# Patient Record
Sex: Female | Born: 1945 | Race: White | Hispanic: Yes | Smoking: Never smoker
Health system: Southern US, Community
[De-identification: ages and names within clinical notes are randomized; demographics above are authoritative.]

## PROBLEM LIST (undated history)

## (undated) DIAGNOSIS — F419 Anxiety disorder, unspecified: Secondary | ICD-10-CM

## (undated) DIAGNOSIS — I639 Cerebral infarction, unspecified: Secondary | ICD-10-CM

## (undated) DIAGNOSIS — F0150 Vascular dementia without behavioral disturbance: Secondary | ICD-10-CM

## (undated) DIAGNOSIS — I82409 Acute embolism and thrombosis of unspecified deep veins of unspecified lower extremity: Secondary | ICD-10-CM

## (undated) DIAGNOSIS — F319 Bipolar disorder, unspecified: Secondary | ICD-10-CM

## (undated) DIAGNOSIS — I10 Essential (primary) hypertension: Secondary | ICD-10-CM

## (undated) DIAGNOSIS — G309 Alzheimer's disease, unspecified: Secondary | ICD-10-CM

## (undated) DIAGNOSIS — E119 Type 2 diabetes mellitus without complications: Secondary | ICD-10-CM

## (undated) DIAGNOSIS — G4730 Sleep apnea, unspecified: Secondary | ICD-10-CM

## (undated) DIAGNOSIS — I2510 Atherosclerotic heart disease of native coronary artery without angina pectoris: Secondary | ICD-10-CM

## (undated) DIAGNOSIS — F0280 Dementia in other diseases classified elsewhere without behavioral disturbance: Secondary | ICD-10-CM

## (undated) HISTORY — DX: Acute embolism and thrombosis of unspecified deep veins of unspecified lower extremity: I82.409

## (undated) HISTORY — DX: Bipolar disorder, unspecified: F31.9

## (undated) HISTORY — DX: Atherosclerotic heart disease of native coronary artery without angina pectoris: I25.10

## (undated) HISTORY — DX: Vascular dementia, unspecified severity, without behavioral disturbance, psychotic disturbance, mood disturbance, and anxiety: F01.50

## (undated) HISTORY — DX: Sleep apnea, unspecified: G47.30

## (undated) HISTORY — DX: Anxiety disorder, unspecified: F41.9

## (undated) HISTORY — DX: Alzheimer's disease, unspecified: G30.9

## (undated) HISTORY — DX: Dementia in other diseases classified elsewhere without behavioral disturbance: F02.80

---

## 2001-10-17 HISTORY — PX: CARDIAC CATHETERIZATION: SHX172

## 2001-10-17 HISTORY — PX: OTHER SURGICAL HISTORY: SHX169

## 2006-01-03 ENCOUNTER — Ambulatory Visit: Payer: Self-pay | Admitting: Internal Medicine

## 2006-03-01 ENCOUNTER — Ambulatory Visit: Payer: Self-pay | Admitting: Internal Medicine

## 2006-03-06 ENCOUNTER — Inpatient Hospital Stay (HOSPITAL_COMMUNITY): Admission: AD | Admit: 2006-03-06 | Discharge: 2006-03-11 | Payer: Self-pay | Admitting: Internal Medicine

## 2006-03-06 ENCOUNTER — Ambulatory Visit: Payer: Self-pay | Admitting: Internal Medicine

## 2006-03-06 ENCOUNTER — Ambulatory Visit: Payer: Self-pay | Admitting: *Deleted

## 2006-03-09 ENCOUNTER — Encounter: Payer: Self-pay | Admitting: Internal Medicine

## 2006-03-15 ENCOUNTER — Ambulatory Visit: Payer: Self-pay | Admitting: Internal Medicine

## 2006-03-16 ENCOUNTER — Inpatient Hospital Stay (HOSPITAL_COMMUNITY): Admission: EM | Admit: 2006-03-16 | Discharge: 2006-03-21 | Payer: Self-pay | Admitting: Emergency Medicine

## 2006-03-22 ENCOUNTER — Ambulatory Visit: Payer: Self-pay | Admitting: Internal Medicine

## 2006-03-24 ENCOUNTER — Ambulatory Visit: Payer: Self-pay | Admitting: Internal Medicine

## 2006-03-24 ENCOUNTER — Encounter: Admission: RE | Admit: 2006-03-24 | Discharge: 2006-03-24 | Payer: Self-pay | Admitting: Internal Medicine

## 2006-03-29 ENCOUNTER — Encounter: Admission: RE | Admit: 2006-03-29 | Discharge: 2006-03-29 | Payer: Self-pay | Admitting: Internal Medicine

## 2006-04-03 ENCOUNTER — Ambulatory Visit: Payer: Self-pay | Admitting: Internal Medicine

## 2006-04-06 ENCOUNTER — Inpatient Hospital Stay (HOSPITAL_COMMUNITY): Admission: EM | Admit: 2006-04-06 | Discharge: 2006-04-10 | Payer: Self-pay | Admitting: *Deleted

## 2006-04-14 ENCOUNTER — Ambulatory Visit: Payer: Self-pay | Admitting: Internal Medicine

## 2006-04-25 ENCOUNTER — Ambulatory Visit: Payer: Self-pay | Admitting: Internal Medicine

## 2006-05-14 ENCOUNTER — Ambulatory Visit: Payer: Self-pay | Admitting: Internal Medicine

## 2006-05-14 ENCOUNTER — Observation Stay (HOSPITAL_COMMUNITY): Admission: EM | Admit: 2006-05-14 | Discharge: 2006-05-16 | Payer: Self-pay | Admitting: Emergency Medicine

## 2006-05-24 ENCOUNTER — Ambulatory Visit: Payer: Self-pay | Admitting: Internal Medicine

## 2006-05-26 ENCOUNTER — Ambulatory Visit: Payer: Self-pay | Admitting: Internal Medicine

## 2006-06-15 ENCOUNTER — Ambulatory Visit: Payer: Self-pay | Admitting: Internal Medicine

## 2006-06-22 ENCOUNTER — Ambulatory Visit (HOSPITAL_COMMUNITY): Admission: RE | Admit: 2006-06-22 | Discharge: 2006-06-22 | Payer: Self-pay | Admitting: Gastroenterology

## 2006-06-26 ENCOUNTER — Ambulatory Visit: Payer: Self-pay | Admitting: Gastroenterology

## 2006-07-24 ENCOUNTER — Ambulatory Visit: Payer: Self-pay | Admitting: Internal Medicine

## 2006-07-26 ENCOUNTER — Emergency Department (HOSPITAL_COMMUNITY): Admission: EM | Admit: 2006-07-26 | Discharge: 2006-07-26 | Payer: Self-pay | Admitting: Emergency Medicine

## 2006-10-24 ENCOUNTER — Ambulatory Visit: Payer: Self-pay | Admitting: Internal Medicine

## 2006-10-25 LAB — CONVERTED CEMR LAB
ALT: 15 units/L (ref 0–40)
AST: 14 units/L (ref 0–37)
CO2: 31 meq/L (ref 19–32)
Cholesterol: 230 mg/dL (ref 0–200)
Glomerular Filtration Rate, Af Am: 94 mL/min/{1.73_m2}
Glucose, Bld: 85 mg/dL (ref 70–99)
HDL: 60.4 mg/dL (ref >39.0)
LDL DIRECT: 166.7 mg/dL
Potassium: 3.9 meq/L (ref 3.5–5.1)
Triglyceride fasting, serum: 50 mg/dL (ref 0–149)

## 2006-10-26 ENCOUNTER — Encounter: Payer: Self-pay | Admitting: Internal Medicine

## 2006-10-26 LAB — CONVERTED CEMR LAB: Glucose, Bld: 83 mg/dL (ref 70–99)

## 2006-11-06 ENCOUNTER — Encounter: Admission: RE | Admit: 2006-11-06 | Discharge: 2006-11-06 | Payer: Self-pay | Admitting: Internal Medicine

## 2006-11-21 ENCOUNTER — Ambulatory Visit: Payer: Self-pay | Admitting: Internal Medicine

## 2006-12-06 ENCOUNTER — Ambulatory Visit: Payer: Self-pay | Admitting: Internal Medicine

## 2007-01-29 ENCOUNTER — Ambulatory Visit: Payer: Self-pay | Admitting: Internal Medicine

## 2007-01-29 LAB — CONVERTED CEMR LAB
ALT: 14 units/L (ref 0–40)
Albumin: 3.4 g/dL — ABNORMAL LOW (ref 3.5–5.2)
Alkaline Phosphatase: 74 units/L (ref 39–117)
Cholesterol: 171 mg/dL (ref 0–200)
LDL Cholesterol: 95 mg/dL (ref 0–99)
Total Bilirubin: 0.6 mg/dL (ref 0.3–1.2)
Total Protein: 6.7 g/dL (ref 6.0–8.3)

## 2007-02-01 ENCOUNTER — Ambulatory Visit: Payer: Self-pay | Admitting: Internal Medicine

## 2007-04-12 ENCOUNTER — Ambulatory Visit: Payer: Self-pay | Admitting: Internal Medicine

## 2007-04-12 LAB — CONVERTED CEMR LAB
Basophils Absolute: 0 10*3/uL (ref 0.0–0.1)
Bilirubin Urine: NEGATIVE
Chloride: 106 meq/L (ref 96–112)
Creatinine,U: 106.9 mg/dL
Eosinophils Absolute: 0.1 10*3/uL (ref 0.0–0.6)
GFR calc non Af Amer: 91 mL/min
Glucose, Bld: 106 mg/dL — ABNORMAL HIGH (ref 70–99)
Hemoglobin: 12.2 g/dL (ref 12.0–15.0)
Hgb A1c MFr Bld: 6.3 % — ABNORMAL HIGH (ref 4.6–6.0)
Ketones, ur: NEGATIVE mg/dL
MCHC: 33.8 g/dL (ref 30.0–36.0)
MCV: 90.5 fL (ref 78.0–100.0)
Monocytes Absolute: 0.6 10*3/uL (ref 0.2–0.7)
Monocytes Relative: 7.9 % (ref 3.0–11.0)
Neutrophils Relative %: 51.7 % (ref 43.0–77.0)
Nitrite: NEGATIVE
Potassium: 4.5 meq/L (ref 3.5–5.1)
RDW: 12.1 % (ref 11.5–14.6)
Sodium: 143 meq/L (ref 135–145)
Specific Gravity, Urine: 1.015 (ref 1.000–1.03)
Total Protein, Urine: NEGATIVE mg/dL
Urine Glucose: NEGATIVE mg/dL
Urobilinogen, UA: 0.2 (ref 0.0–1.0)
pH: 6.5 (ref 5.0–8.0)

## 2007-05-07 DIAGNOSIS — E119 Type 2 diabetes mellitus without complications: Secondary | ICD-10-CM | POA: Insufficient documentation

## 2007-05-07 DIAGNOSIS — E1169 Type 2 diabetes mellitus with other specified complication: Secondary | ICD-10-CM

## 2007-05-07 DIAGNOSIS — I10 Essential (primary) hypertension: Secondary | ICD-10-CM

## 2007-05-07 DIAGNOSIS — Z86718 Personal history of other venous thrombosis and embolism: Secondary | ICD-10-CM

## 2007-05-07 DIAGNOSIS — E785 Hyperlipidemia, unspecified: Secondary | ICD-10-CM

## 2007-05-07 DIAGNOSIS — R519 Headache, unspecified: Secondary | ICD-10-CM | POA: Insufficient documentation

## 2007-05-07 DIAGNOSIS — F411 Generalized anxiety disorder: Secondary | ICD-10-CM | POA: Insufficient documentation

## 2007-05-07 DIAGNOSIS — J45909 Unspecified asthma, uncomplicated: Secondary | ICD-10-CM | POA: Insufficient documentation

## 2007-05-07 DIAGNOSIS — R51 Headache: Secondary | ICD-10-CM

## 2007-05-09 ENCOUNTER — Ambulatory Visit: Payer: Self-pay | Admitting: Internal Medicine

## 2007-05-10 ENCOUNTER — Encounter: Payer: Self-pay | Admitting: Internal Medicine

## 2007-05-24 ENCOUNTER — Ambulatory Visit: Payer: Self-pay | Admitting: Internal Medicine

## 2007-05-24 LAB — CONVERTED CEMR LAB
Bilirubin Urine: NEGATIVE
Crystals: NEGATIVE
Nitrite: NEGATIVE
Specific Gravity, Urine: 1.02 (ref 1.000–1.03)
Total Protein, Urine: NEGATIVE mg/dL
pH: 6 (ref 5.0–8.0)

## 2007-06-12 ENCOUNTER — Ambulatory Visit: Payer: Self-pay | Admitting: Internal Medicine

## 2007-06-12 LAB — CONVERTED CEMR LAB
Ketones, ur: NEGATIVE mg/dL
Specific Gravity, Urine: 1.01 (ref 1.000–1.03)
pH: 6.5 (ref 5.0–8.0)

## 2007-06-15 ENCOUNTER — Ambulatory Visit: Payer: Self-pay | Admitting: Internal Medicine

## 2007-06-15 ENCOUNTER — Observation Stay (HOSPITAL_COMMUNITY): Admission: EM | Admit: 2007-06-15 | Discharge: 2007-06-18 | Payer: Self-pay | Admitting: Emergency Medicine

## 2007-06-21 ENCOUNTER — Ambulatory Visit: Payer: Self-pay | Admitting: Internal Medicine

## 2007-06-27 ENCOUNTER — Ambulatory Visit (HOSPITAL_COMMUNITY): Admission: RE | Admit: 2007-06-27 | Discharge: 2007-06-27 | Payer: Self-pay | Admitting: Internal Medicine

## 2007-07-04 ENCOUNTER — Ambulatory Visit: Payer: Self-pay | Admitting: Vascular Surgery

## 2007-07-10 ENCOUNTER — Ambulatory Visit: Payer: Self-pay | Admitting: Internal Medicine

## 2007-08-12 IMAGING — CT CT HEAD W/O CM
1 of 2 series · 13 of 30 positions shown, 17 images · IV contrast (agent unspecified)
Comparison: None.

CLINICAL DATA: Headaches and right-sided weakness. 
HEAD CT WITHOUT CONTRAST:
TECHNIQUE: Contiguous axial images were obtained from the base of the skull through the vertex according to standard protocol without contrast.

[Series 2: brain · axial · 0.47mm/px · z∈[+105,+228]mm · 13 of 28 slices shown, 17 images]
[im 2/28  brain]
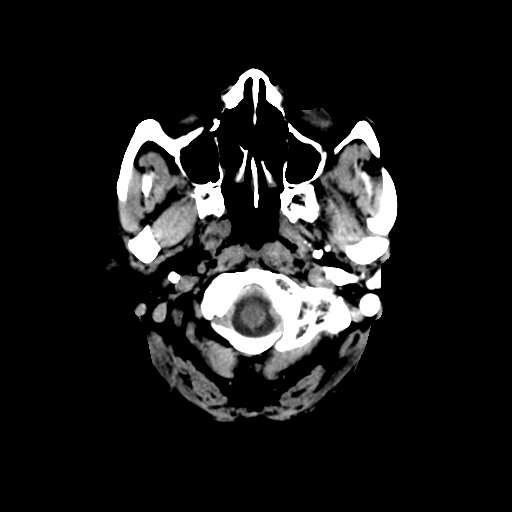
[im 2/28  bone]
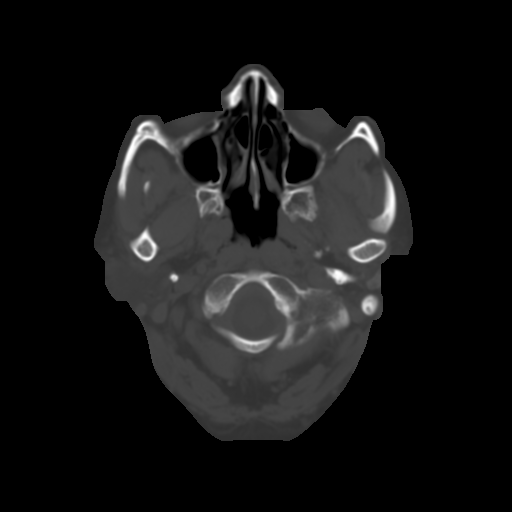
[im 4/28  brain]
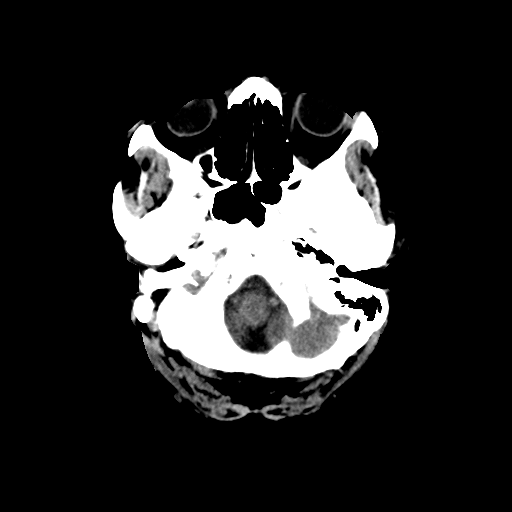
[im 6/28  brain]
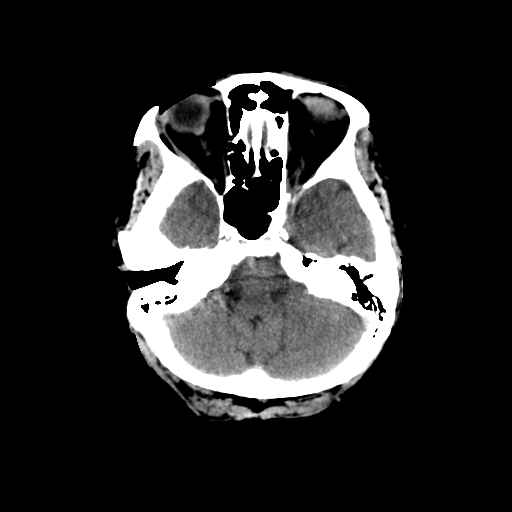
[im 8/28  brain]
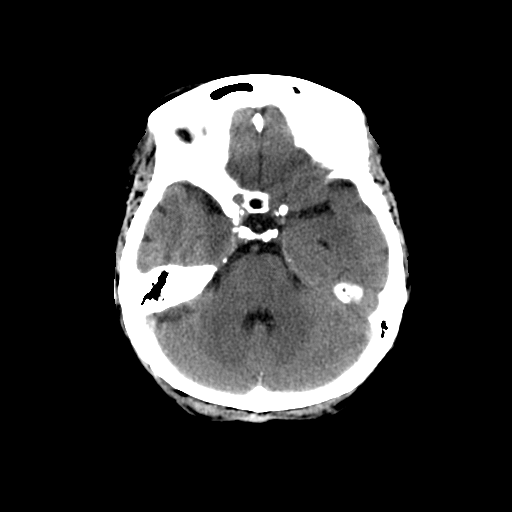
[im 10/28  brain]
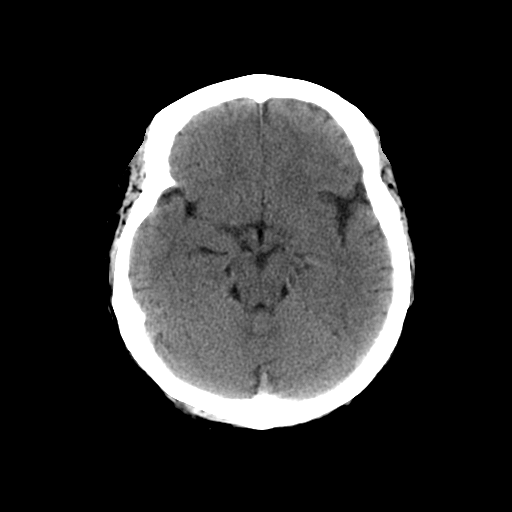
[im 10/28  bone]
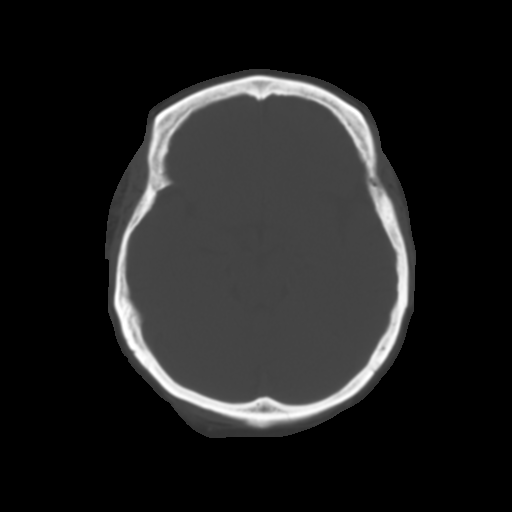
[im 12/28  brain]
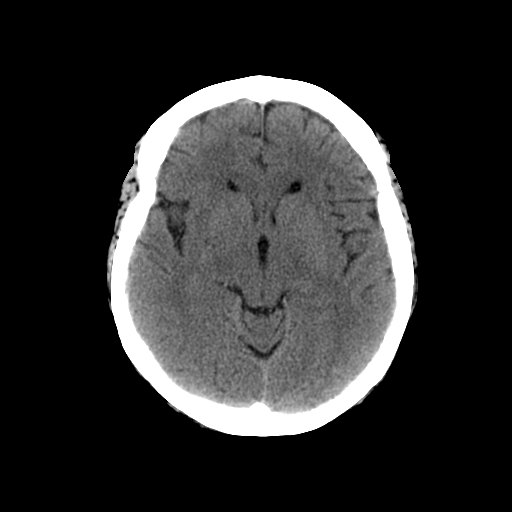
[im 14/28  brain]
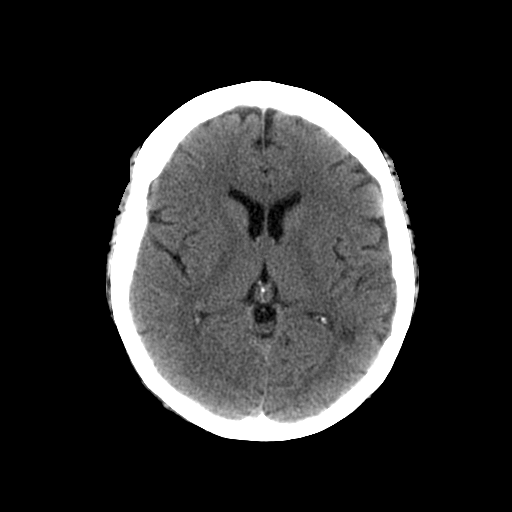
[im 16/28  brain]
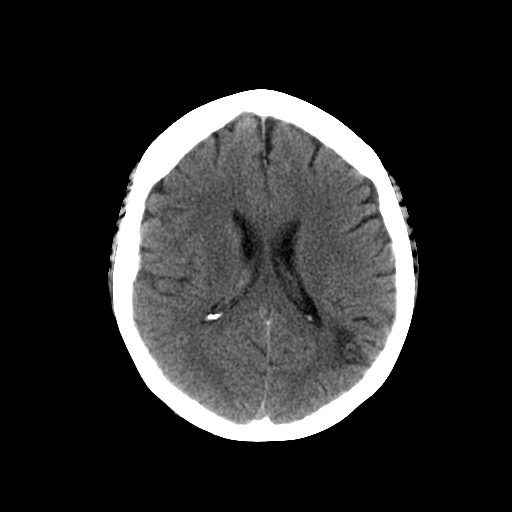
[im 18/28  brain]
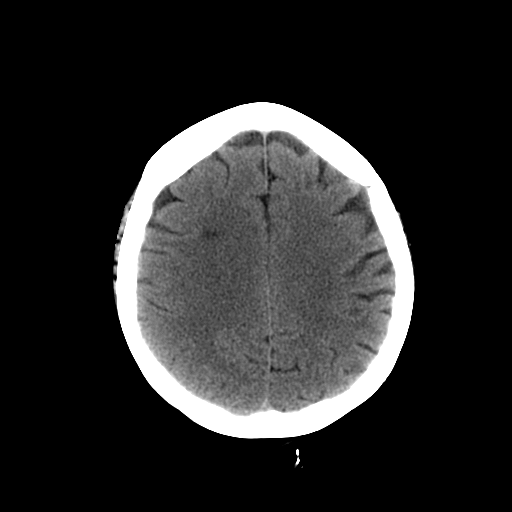
[im 18/28  bone]
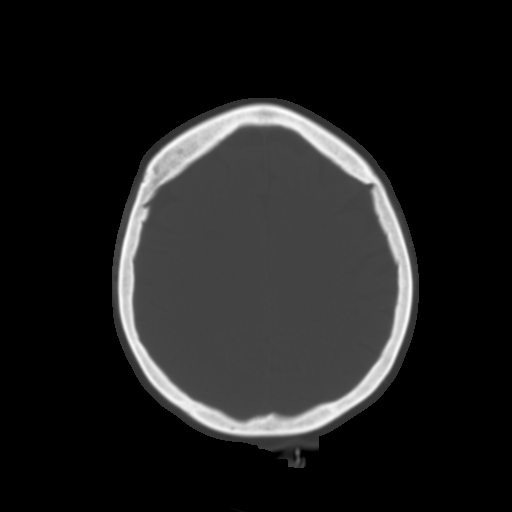
[im 20/28  brain]
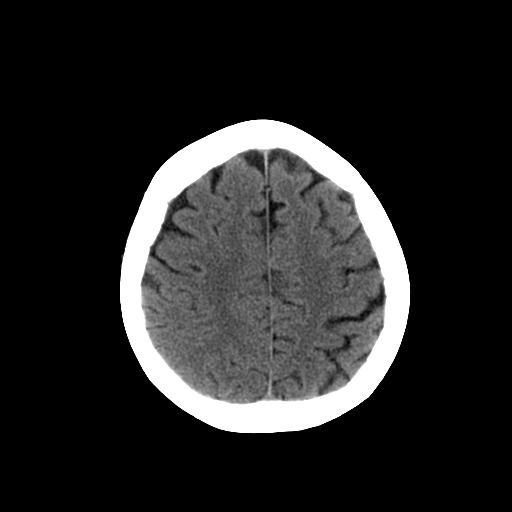
[im 22/28  brain]
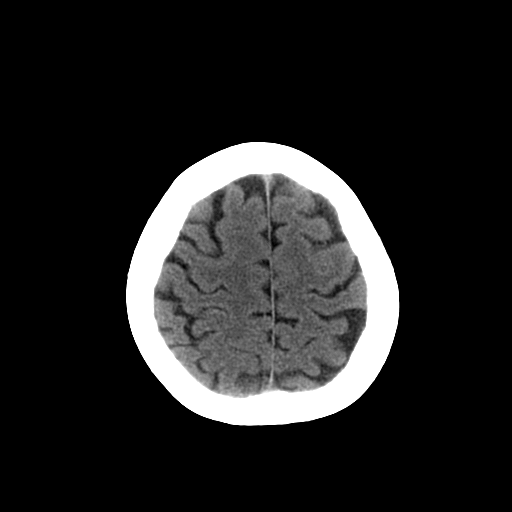
[im 24/28  brain]
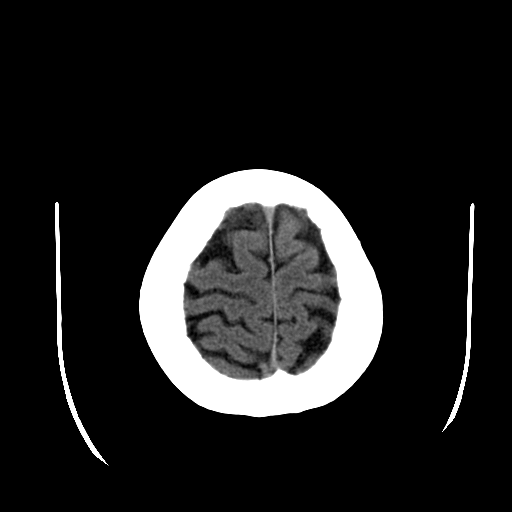
[im 26/28  brain]
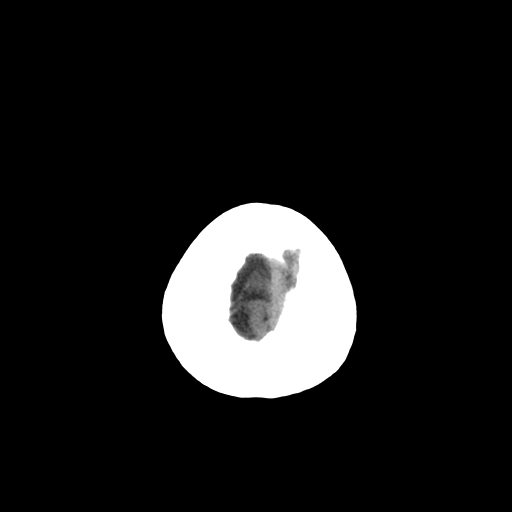
[im 26/28  bone]
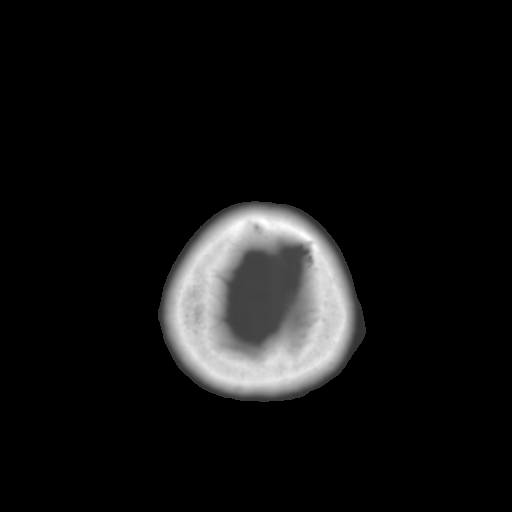

[13 of 30 positions shown; findings below may reference images not displayed]

FINDINGS: A small area of decreased attenuation is seen in the posterior left parietal lobe which involves white matter and appears to spare gray matter.  There is no evidence of associated mass effect.  This may represent a chronic area of encephalomalacia although an early acute or late subacute infarct cannot be excluded.  No other intraaxial abnormalities are seen.  There is no evidence of hemorrhage or mass effect.  Ventricles are normal in size.
IMPRESSION: Small area of decreased attenuation involving the posterior left parietal lobe white matter.  This may be chronic although an early acute or late subacute infarct cannot definitely be excluded;  consider further evaluation with brain MRI.

## 2007-08-24 IMAGING — CT CT PELVIS W/O CM
1 of 2 series · 15 of 32 positions shown, 19 images · IV contrast (agent unspecified)
Comparison: None.

CLINICAL DATA: Abdominal and pelvic pain.  Nausea and vomiting.  Renal insufficiency.
 ABDOMEN CT WITHOUT CONTRAST:
TECHNIQUE: Multidetector CT imaging of the abdomen was performed following the standard protocol without IV contrast.  Intravenous contrast was not utilized secondary to patient?s elevated creatinine.
TECHNIQUE: Multidetector CT imaging of the pelvis was performed following the standard protocol without IV contrast.

[Series 3: abd/pelv w/o 5.0 b31f st · axial · non-contrast · 0.93mm/px · z∈[-413,-23]mm · 15 of 86 slices shown, 19 images]
[im 4/86  soft-tissue]
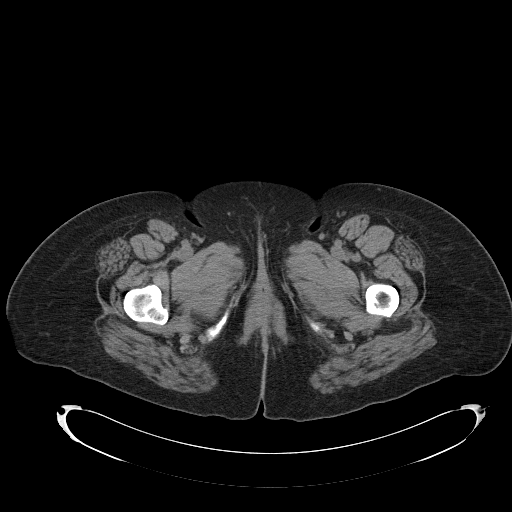
[im 4/86  bone]
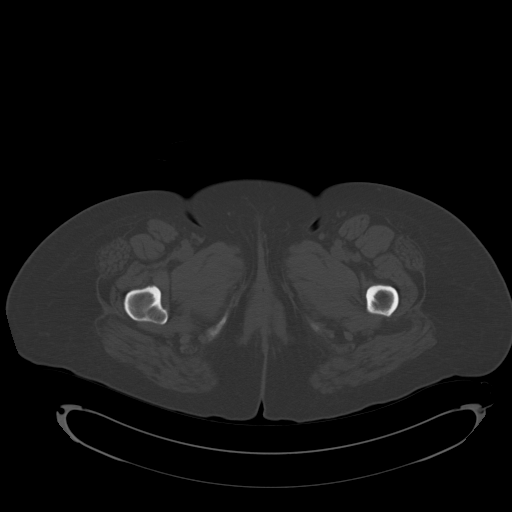
[im 11/86  soft-tissue]
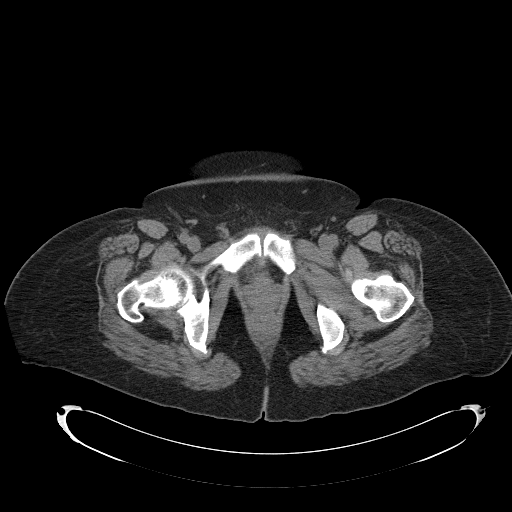
[im 18/86  soft-tissue]
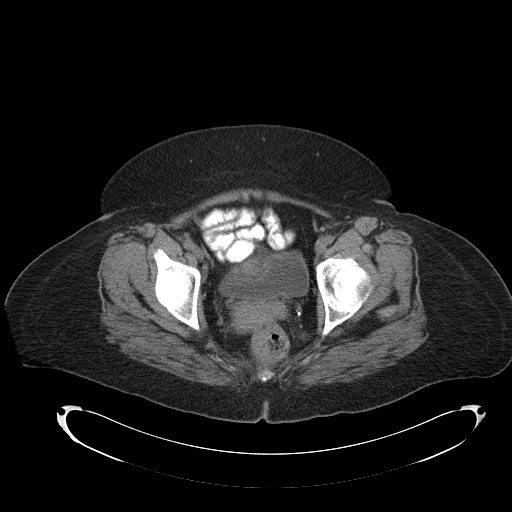
[im 24/86  soft-tissue]
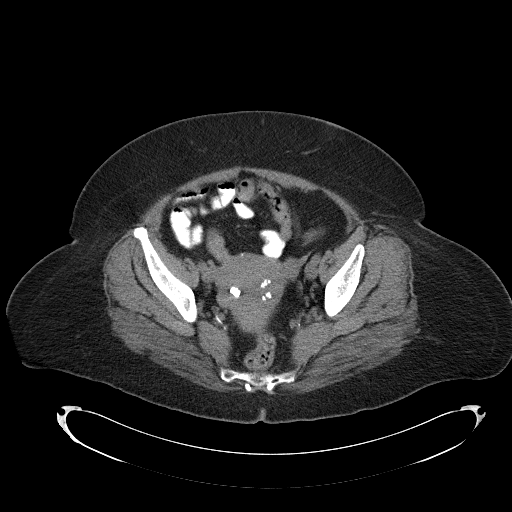
[im 31/86  soft-tissue]
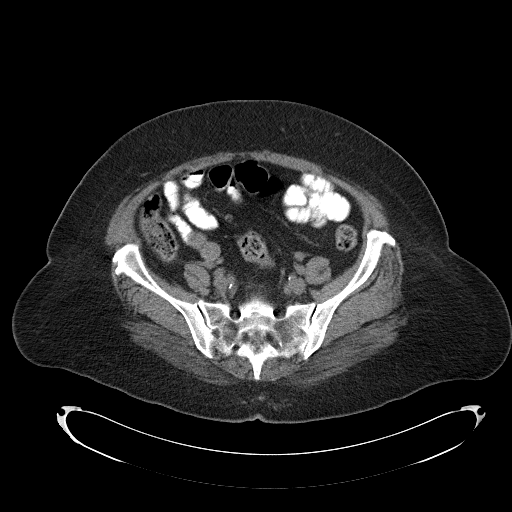
[im 38/86  soft-tissue]
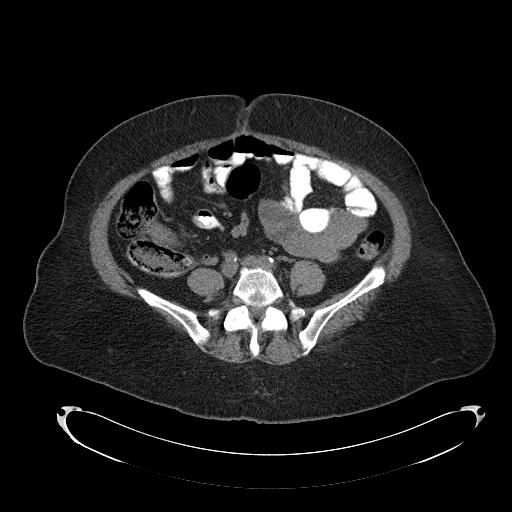
[im 45/86  soft-tissue]
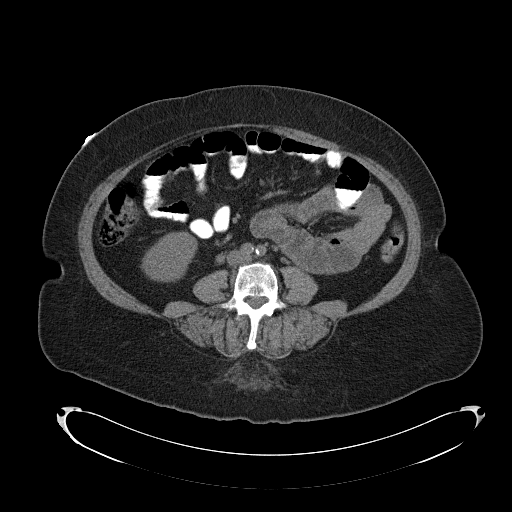
[im 48/86  soft-tissue]
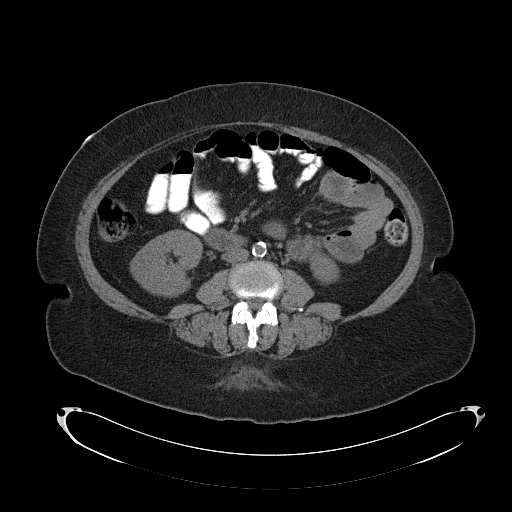
[im 55/86  soft-tissue]
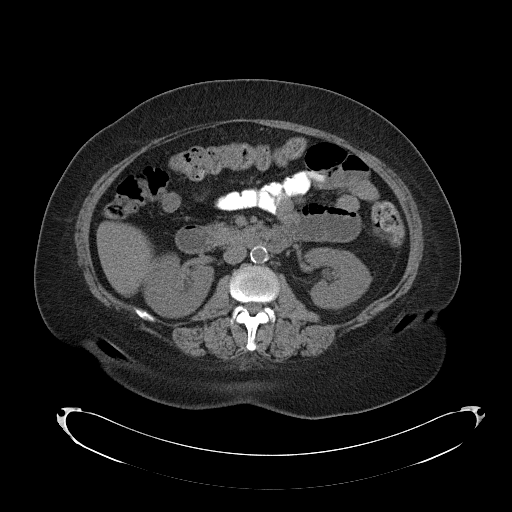
[im 55/86  bone]
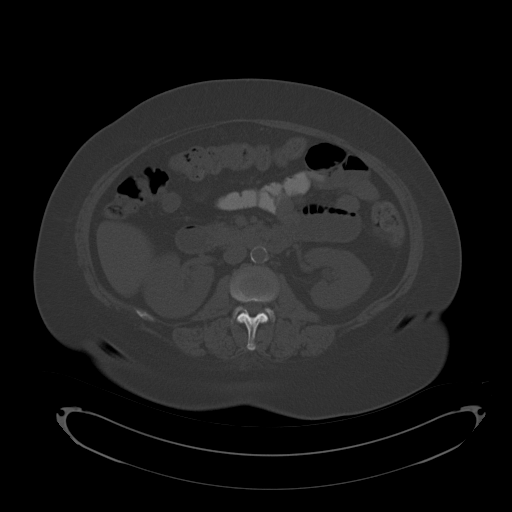
[im 62/86  soft-tissue]
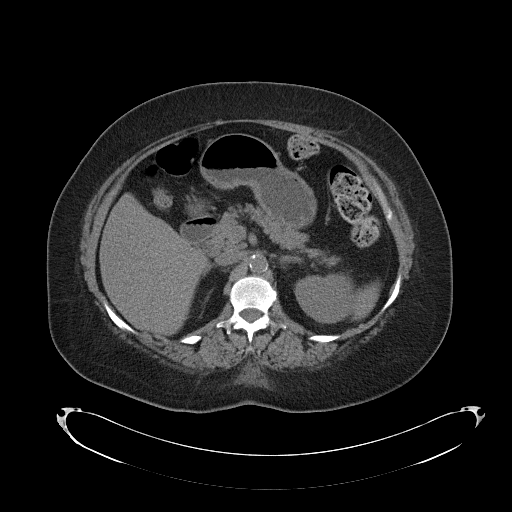
[im 69/86  soft-tissue]
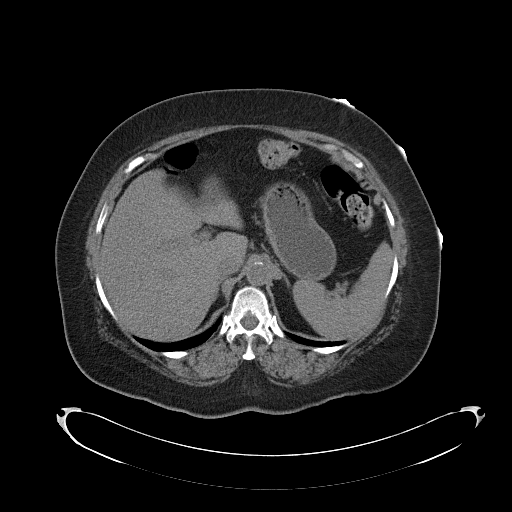
[im 72/86  lung]
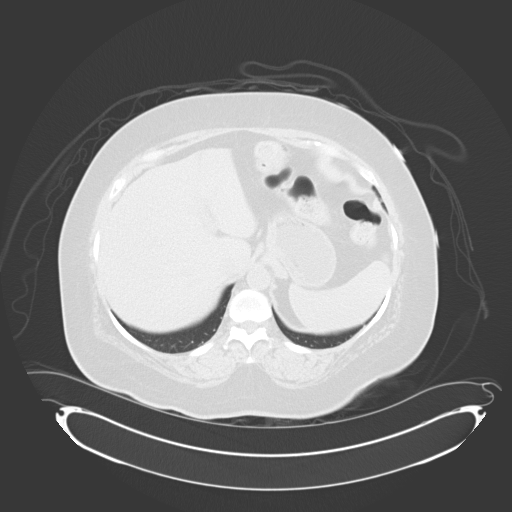
[im 75/86  soft-tissue]
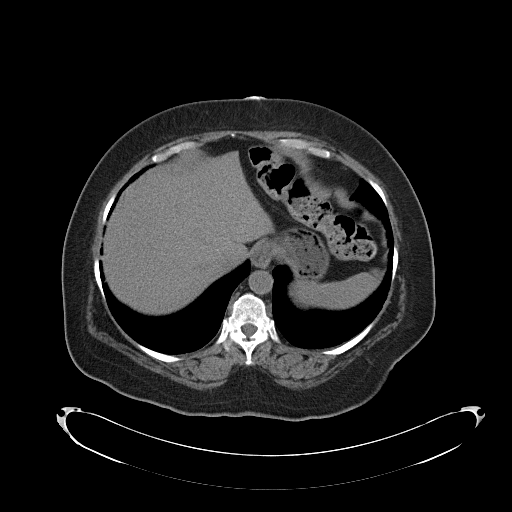
[im 75/86  lung]
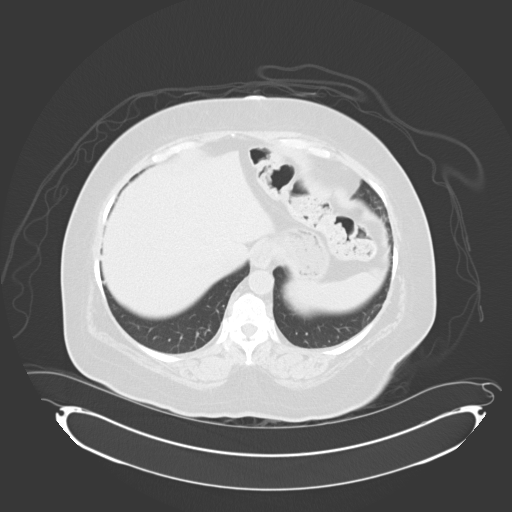
[im 79/86  lung]
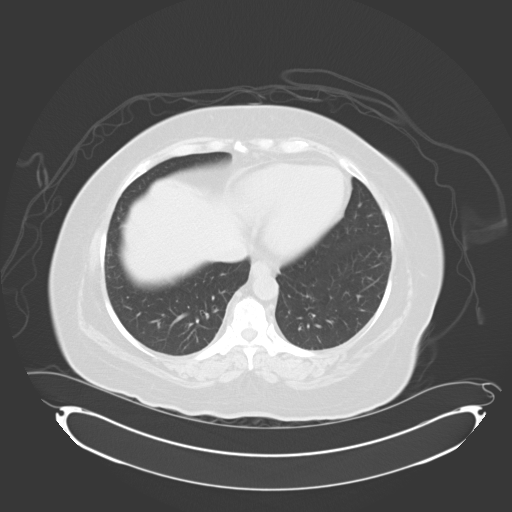
[im 82/86  soft-tissue]
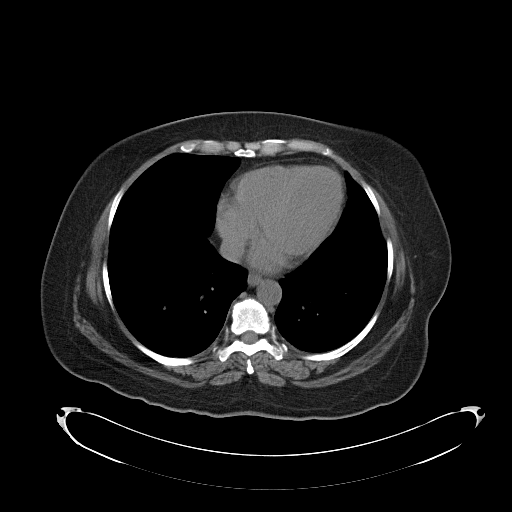
[im 82/86  lung]
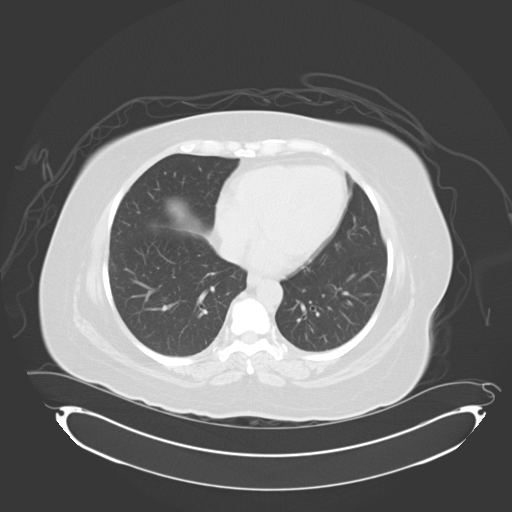

[15 of 32 positions shown; findings below may reference images not displayed]

FINDINGS: The liver, spleen, adrenal glands, kidneys, and pancreas are unremarkable.  Patient is status post cholecystectomy.  No evidence of enlarged lymph nodes, free fluid, abdominal aortic aneurysm, or biliary dilatation.  The visualized bowel is unremarkable.  The appendix is normal.
IMPRESSION: No acute abnormality.
 PELVIS CT WITHOUT CONTRAST:
FINDINGS: Several calcified uterine fibroids are noted.  The bladder and bowel are unremarkable.  No evidence of enlarged lymph nodes or free fluid.
IMPRESSION: No acute abnormality.

## 2007-09-10 ENCOUNTER — Telehealth: Payer: Self-pay | Admitting: Internal Medicine

## 2007-09-11 ENCOUNTER — Encounter: Payer: Self-pay | Admitting: Internal Medicine

## 2010-11-07 ENCOUNTER — Encounter: Payer: Self-pay | Admitting: Gastroenterology
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
## 2011-03-01 NOTE — Procedures (Signed)
CAROTID DUPLEX EXAM   INDICATION:  Follow up carotid artery disease.   HISTORY:  Diabetes:  Yes  Cardiac:  No  Hypertension:  Yes  Smoking:  No  Previous Surgery:  No  CV History:  Possible TIAs, recently, and alternate sides  Amaurosis Fugax Yes, Paresthesias Yes , Hemiparesis Yes                                       RIGHT             LEFT  Brachial systolic pressure:         145               145  Brachial Doppler waveforms:         Triphasic         Triphasic  Vertebral direction of flow:        Antegrade         Antegrade  DUPLEX VELOCITIES (cm/sec)  CCA peak systolic                   61                71  ECA peak systolic                   160               95  ICA peak systolic                   108               134  ICA end diastolic                   41                42  PLAQUE MORPHOLOGY:                  Calcified         Calcified  PLAQUE AMOUNT:                      Mild              Mild  PLAQUE LOCATION:                    ICA/bifurcation   ICA/bifurcation   IMPRESSION:  1. The right internal carotid artery shows evidence of 20-39% stenosis      (top end of range).  2. The left internal carotid artery shows evidence of 40-59% stenosis.  3. No significant changes from previous study.   ___________________________________________  Janetta Hora Fields, MD   AS/MEDQ  D:  07/04/2007  T:  07/05/2007  Job:  [id number]

## 2011-03-01 NOTE — Procedures (Signed)
EEG NUMBER:  [id number].   This is a 65 year old patient who has a known history of strokes and one  and a half years ago began having episodes of dizziness, facial drooping  and at times her eyes rolling back.   MEDICINES:  Listed are Aggrenox, Benicar, triamterene, Lipitor,  metformin ER, Taztia, fluoxetine, aspirin and Clarinex.   TECHNICAL DESCRIPTION:  This EEG was recorded during the awake and  drowsy states but was not recorded during the stage II sleep state.  The  background activity shows 10 Hz rhythms with higher amplitudes seen in  the posterior head regions bilaterally.  The alpha rhythms are blocked  by eye opening and accentuated by eye closing maneuvers.  Some transient  sharp waves or at times seen in the right temporal region but no  definite spike activity and no epileptiform activity is seen.  Photic  stimulation was performed which produced a minimal driving response in  the occipital head regions.  Hyperventilation testing did not produce  any significant abnormalities.  Although drowsiness was recorded, there  was no evidence of any stage II sleep present during this study.   IMPRESSION:  This is a normal EEG during the awake state.  It is  recommended that for the possibility of epileptiform activity,  consideration of a sleep-deprived EEG should be obtained.           ______________________________  Genene Churn. Sandria Manly, [name]     ZOX:WRUE  D:  06/27/2007 09:17:29  T:  06/27/2007 16:09:03  Job #:  [id number]   cc:   Barbette Hair. [place], DO  [address] [place], [place] [address]
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
## 2011-03-01 NOTE — Assessment & Plan Note (Signed)
OFFICE VISIT   [name], [name]  DOB:  [birth year]                                       07/04/2007  ZOXWR#:[id number]   The patient returns for followup today for known mild to moderate  carotid stenosis.  She was last seen in November of 2007.  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  She was admitted to [facility] in September of this year for recurrent TIA type symptoms.  She  was evaluated by Dr. Lesia Sago from neurology.  She had an MRA of the  brain, which showed evolution of a left temporal occipital ischemic  event in the past.  She also had progressive occlusion of her right MCA  intracranial vessels, as well as subtotal occlusion of her left MCA  intracranial vessels.  An EEG was arranged for her as well as followup  with Dr. Tiajuana Amass, her psychiatrist.  She has had no further  episode since discharge from the hospital.  She is currently taking  Aggrenox.  She does not speak [place] and the interview today was  conducted through her daughter, translating.   PHYSICAL EXAM:  Blood pressure is 140/80, heart rate 76 and regular.  HEENT is unremarkable.  She has no carotid bruits.  Chest clear to  auscultation.  Cardiac exam is regular rate and rhythm without murmur.  Upper extremity and lower extremity motor exam was 5/5 and symmetric  bilaterally.  She has no pronator drift.   She had a repeat carotid duplex exam today, which showed less than 40%  stenosis of the right internal carotid artery and 40-60% stenosis of the  left internal carotid artery.  This is essentially unchanged from  November of 2007.  Although this patient does have a moderate carotid  stenosis on the left side, I do not believe that this explains her  recent episodes that resulted in her hospitalization in September.  She  most likely has intracranial disease as the major  cause of her symptoms,  although further workup is in progress by Dr. Artist Pais, Dr. Anne Hahn, and her  psychiatrist.  I do not believe that she warrants carotid endarterectomy  at this time.  However, she does warrant  further followup.  We will schedule her for a repeat carotid duplex exam  in 6 months' time.  I also encouraged her to continue to take her  Aggrenox.   Janetta Hora. Fields, MD  Electronically Signed   CEF/MEDQ  D:  07/04/2007  T:  07/05/2007  Job:  [id number]   cc:   Barbette Hair. Artist Pais, DO

## 2011-03-04 NOTE — Consult Note (Signed)
NAME:  [name], [name]         ACCOUNT NO.:  [id number]   MEDICAL RECORD NO.:  [id number]          PATIENT TYPE:  EMS   LOCATION:  MAJO                         FACILITY:  [facility]   PHYSICIAN:  Marlan Palau, [name]  DATE OF BIRTH:  [birth year]   DATE OF CONSULTATION:  07/26/2006  DATE OF DISCHARGE:  07/26/2006                                   CONSULTATION   HISTORY OF PRESENT ILLNESS:  [name] is a 65 year old right-  handed Hispanic female born [birth year] with a history of diabetes,  hypertension, cerebrovascular disease.  The patient suffered a left brain  stroke, right hemiparesis in May 2007 and underwent a stroke workup at that  time.  The evaluation at that point shows evidence of stroke involving the  left parietal area and MRI angiogram shows occlusion of the right M1 segment  of the middle cerebral artery and irregular high-grade stenosis of the left  M1 segment as well.  The patient underwent an echocardiogram, both trends  esophageal as well as transthoracic.  No intracardiac shunt was noted on the  transesophageal echo.  The transthoracic echo reveals normal left  ventricular systolic function.  The patient had no significant valvular  disease.  The patient has been treated with Aggrenox and has done fairly  well with this.  The patient underwent cataract surgery the day prior to  this evaluation and had gone for a checkup following surgery today.  The  patient has been in the bank earlier today and then suddenly felt dizzy but  only had a few seconds warning and then lost consciousness.  The patient  woke up on the ground and a few seconds later had not bitten her tongue, did  not lose control of the bowels or bladder.  The patient initially was noted  to have some right-sided weakness but this rapidly cleared and had normalize  by the time the patient had gotten to the emergency room.  CT scan of the  head shows evidence of old well documented left  parietal stroke.  No acute  changes were seen.  Neurology saw this patient through via code stroke.  The  patient does report frequent chest pains.  States the chest pain generally  are fairly brief, was told she has angina in the past but is not clear if  she has had a full cardiac workup and has been seen by [facility]  before.  At the time of this clinical evaluation, the patient's deficits had  completely resolved.   PAST MEDICAL HISTORY:  1. Significant for history of syncope today.  2. Right cataract surgery the day prior to this evaluation.  3. History of stroke in May of 2007 with cerebrovascular disease as above.  4. History of diabetes.  5. History of hypertension.  6. History of gallbladder resection.  7. History of hypercholesterolemia.  8. History of bilateral tubal ligation.  9. History of anxiety and depression.  10.History of obesity.   Her medications include Aggrenox one twice daily.  The patient is also on  aspirin therapy.  The patient is on Benicar 40 mg daily, metformin  I believe  taking 1 gram b.i.d., is on glimepiride unknown dose one daily, Diazide  37.5/25 mg 1 tablet daily, Cartia XT 240 mg daily, Zocor 20 mg a day, Prozac  40 mg a day, Darvocet as needed.   The patient does not smoke or drink.  Has allergies to CODEINE.   SOCIAL HISTORY:  The patient is divorced, lives in the [place] area, is  retired.  Has one daughter, one son. The daughter has diabetes and obesity.   Family Medical history is noted that both parents died with strokes.  The  patient has 12 siblings, some of which have had strokes, many others have  diabetes and hypertension.  No family history of cancer noted.   REVIEW OF SYSTEMS:  Notable for no recent fevers, chills.  The patient does  note a headache today on the left side of the head.  Some neck pain on the  left side.  The patient denies any history of diaphoresis.  Denies shortness  of breath.  Does have history of  frequent chest pains.  Denies abdominal  pain, nausea, vomiting, trouble controlling the bowels or bladder.  At this  point, notes no numbness or weakness of arms or legs, visual complaints per  se.   PHYSICAL EXAMINATION:  VITALS:  Blood pressure is 108/64, heart rate 55,  respiratory rate 20, temperature afebrile.  GENERAL:  The patient is a minimally to moderately obese Hispanic female who  is alert, cooperative at the time of examination.  HEENT EXAMINATION:  Head is atraumatic.  Eyes:  Pupil on the right is  postsurgical, round, react to light.  Disks on both sides are flat.  Pupil  left round and reactive to light.  NECK:  Supple.  No carotid bruits noted.  RESPIRATORY EXAMINATION:  Clear.  CARDIOVASCULAR:  Examination reveals a regular rate and rhythm.  No obvious  murmurs or rubs noted.  EXTREMITIES:  Without significant edema.  ABDOMEN:  Reveals positive bowel sounds.  No organomegaly or tenderness  noted.  NEUROLOGIC EXAMINATION:  The patient has symmetric facies.  Good sensation  of face to pinprick, soft touch bilaterally.  Has full extraocular  movements.  Visual fields are full to double simultaneous stimulation.  Speech is fairly well enunciated.  The patient does not speak English  extremely well.  No clear aphasia noted.  Motor testing reveals 5/5 strength  in all fours.  Good symmetric motor tone is noted throughout.  Sensory  testing reveals some slight decreased pinprick sensation on the right  forearm as compared to the left.  Otherwise symmetric throughout.  Vibratory  sensation is symmetric on all fours.  Patient has good venous finger-nose-  finger and heel-to-shin.  Gait was not tested.  Deep tendon reflexes are  symmetric.  Toes neutral bilaterally.  No drift is seen in the upper  extremities.   LABORATORY VALUES:  At this time are pending.  Chest x-ray, EKG pending.  CT  of the head is as above.   IMPRESSION: 1. Syncopal event.  2. Transient right-sided  weakness secondary to low-flow phenomenon.  No      evidence of new cerebrovascular disease.  3. Diabetes.  4. Hypertension.  5. Mild obesity.   This patient has had an episode of what probably was a drop in blood  pressure for some reason.  The patient has known middle cerebral artery  blockages with occlusion on the right and high-grade stenosis on the left.  With the drop in blood  pressure, the patient had focal deficits associated  with the combination of blood pressure change and fixed cerebrovascular  deficits.  The strength on the right side has completely resolved quickly  after the patient's blood pressure was normalized.  I do not believe this  patient deserves another complete cerebrovascular disease  workup.  This patient, however, has had syncopal event for some reason.  I  would defer this workup back to the emergency room physician/primary care  physician.  Will be happy to follow this patient while in-house if needed.  No further recommendations are made in regards to the cerebrovascular  disease.  Thank you very much.      Marlan Palau, [name]  Electronically Signed     CKW/MEDQ  D:  07/26/2006  T:  07/27/2006  Job:  [id number]   cc:   [facility]  Corwin Levins, MD  [facility]

## 2011-03-04 NOTE — Consult Note (Signed)
NAMECamilah, Spillman Torra         ACCOUNT NO.:  [id number]   MEDICAL RECORD NO.:  [id number]          PATIENT TYPE:  INP   LOCATION:  4729                         FACILITY:  [facility]   PHYSICIAN:  Genene Churn. Love, [name]    DATE OF BIRTH:  12-19-1945   DATE OF CONSULTATION:  DATE OF DISCHARGE:                                   CONSULTATION   ADDENDUM:  Apparently 2-D echocardiogram raised the question of a PFO and a  bubble study with transcranial Doppler will be obtained.           ______________________________  Genene Churn. Sandria Manly, [name]     JML/MEDQ  D:  03/08/2006  T:  03/08/2006  Job:  [id number]

## 2011-03-04 NOTE — Consult Note (Signed)
NAMEMackinsey, [name]         ACCOUNT NO.:  [id number]   MEDICAL RECORD NO.:  [id number]          PATIENT TYPE:  INP   LOCATION:  4729                         FACILITY:  [facility]   PHYSICIAN:  Janetta Hora. Fields, MD  DATE OF BIRTH:  1946-06-12   DATE OF CONSULTATION:  03/10/2006  DATE OF DISCHARGE:                                   CONSULTATION   REASON FOR CONSULTATION:  Possible symptomatic carotid stenosis.   HISTORY OF PRESENT ILLNESS:  The patient is a 65 year old female who had an  episode of right hand weakness and clumsiness approximately 5 days ago. The  episode has slowly resolved over the past few days but she still has some  right hand weakness.  She was initially unable to write without the  assistance of her left hand. She is able to write more normally now. She  subsequently had an episode of left sided weakness and dysarthria which  lasted a few hours on hospital day number 2.  An MRI during this hospital  admission showed a chronic left parietal infarct with occlusion of a branch  of the left middle cerebral artery. There was also severe stenosis of the  left middle cerebral artery branch.  She also had a carotid duplex scan  which suggested a 40-60% right internal carotid artery stenosis and  approximately 60% left internal carotid artery stenosis. She had a prior  stroke or TIA approximately 10 years ago and recovered from this. Her  atherosclerotic risk factors include diabetes, hypertension, increased  cholesterol, no tobacco use. She also had a transesophageal echo during this  hospital admission which showed no patent foramen ovale.   PAST MEDICAL HISTORY:  As above. In addition, obesity, asthma, depression.   MEDICATIONS:  Metformin, Claritin, diltiazem, Prozac, Avapro, Zocor,  Januvia, Aggrenox, heparin.   PAST SURGICAL HISTORY:  She had a bilateral tubal ligation and a  cholecystectomy.   ALLERGIES:  CODEINE.   REVIEW OF SYSTEMS:  She denies any  prior history of myocardial infarction  and apparently had a normal cardiac catheterization 2 years ago.  She denies  shortness of breath or renal insufficiency. She denies history of  hypercoagulable state or syncope.   PHYSICAL EXAMINATION:  VITAL SIGNS:  Blood pressure is 106/52, temperature  98.2, respiration 16, pulse 72.  GENERAL:  She is an Hispanic female in no acute distress.  HEENT:  Normal.  NECK:  Shows no carotid bruits with 2+ carotid pulses.  CHEST:  Clear to auscultation.  CARDIAC EXAM:  Regular rate and rhythm without murmur, rub, or gallop.  ABDOMEN:  Obese, soft, nontender.  EXTREMITIES:  She has 2+ radial and femoral pulses bilaterally.  NEUROLOGIC EXAM:  Shows 4/5 hand intrinsic muscles as well as arm flexors  and extensors. Right lower extremity has 5/5 strength. Left side has no  weakness in the left upper or lower extremity. Cranial nerves II-XII are  intact.   ASSESSMENT:  Bilateral hemispheric symptoms over the last 5 days with prior  old left CVA and intracranial occlusive disease.  She has had bilateral  events and has only moderate carotid stenosis. These may be  cardioembolic as  suggested by Dr. Pearlean Brownie previously.  I will review her carotid duplex  examination and discuss the findings with neurology to determine whether or  not she would benefit from carotid endarterectomy.      Janetta Hora. Fields, MD  Electronically Signed     CEF/MEDQ  D:  03/10/2006  T:  03/10/2006  Job:  [id number]

## 2011-03-04 NOTE — H&P (Signed)
NAMEMilka, Windholz Markeeta         ACCOUNT NO.:  [id number]   MEDICAL RECORD NO.:  [id number]          PATIENT TYPE:  EMS   LOCATION:  MAJO                         FACILITY:  [facility]   PHYSICIAN:  Denyse Amass, MD DATE OF BIRTH:  11-Apr-1946   DATE OF ADMISSION:  04/06/2006  DATE OF DISCHARGE:                                HISTORY & PHYSICAL   CHIEF COMPLAINT:  Nausea and vomiting.   HISTORY OF PRESENT ILLNESS:  A 65 year old woman with a history of recent  bihemsipheric stroke, other issues as listed below, who presents with a  three-day history of nausea and vomiting, unable to keep down fluids, with  mild epigastric pain which only accompanies the nausea. No diarrhea. No  fever, chills, chest pain, shortness of breath, dyspnea on exertion, PND, or  orthopnea. She was last admitted in June 2007 with bacteruria and pyuria.   PAST MEDICAL HISTORY:  1.  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  2.  Carotid artery stenosis.  3.  Hypertension.  4.  Type 2 diabetes.  5.  Hypercholesterolemia.  6.  Anxiety/depression.  7.  Seasonal allergies.  8.  Pyuria/bacteruria.   ALLERGIES:  CODEINE.   MEDICATIONS:  1.  Aggrenox 25/200 one b.i.d.  2.  Benicar 40 mg p.o. daily.  3.  Metformin 1 gm b.i.d.  4.  Triamterene/hydrochlorothiazide 37.5/25 mg daily.  5.  Fluoxetine 40 mg daily.  6.  Clarinex 5 mg daily.  7.  Cartia-XT 240 mg daily.  8.  Zocor 20 mg daily.  9.  Jenuvia 100 mg daily.  10. Darvocet p.r.n.   SOCIAL HISTORY:  She lives in [place]. Her daughter is very attentive.  She does not smoke, drink, or use drugs.   FAMILY HISTORY:  Significant for stroke.   REVIEW OF SYSTEMS:  Otherwise negative besides what is stated in the HPI.   PHYSICAL EXAMINATION:  Her temperature 98.7, pulse 68, respiratory rate 14,  blood pressure 109/60 sitting and standing 85/50. The standing pulse is 85  to 88.  GENERAL:  She is a very pleasant, Hispanic woman who appears comfortable.  HEENT:   Normocephalic and atraumatic cranium. Pupils equal, round, and  reactive to light and accommodation. Extraocular movements are intact.  Sclerae are clear. Moist mucous membranes, with fair dentition. There are no  significant posterior pharyngeal lesions, erythema, or exudates.  NECK: Supple. 2+ carotid bruit. Jugular venous pulsations are normal. No  lymphadenopathy.  CARDIOVASCULAR: PMI is mildly deviated. S1 and S2 are regular with no S3 or  S4.  There is a 2/6 holosystolic murmur at the left lower sternal border. No  respiratory variation. Pulses are otherwise equal throughout.  LUNGS: Clear to auscultation with poor breath sounds at the right base.  SKIN: No lesions.  ABDOMEN: Soft, nontender, no significant peritoneal signs. No rebound or  guarding appreciated.  The liver is 4 cm by percussion.  RECTAL EXAM: Heme-negative brown stools.  EXTREMITIES: No clubbing, cyanosis, or edema.  MUSCULOSKELETAL: No significant joint deformities or effusions. No spine or  CVA tenderness.  NEUROLOGIC: She is alert and oriented times three. Cranial nerves II-XII are  grossly intact.  Strength is 5/5 in all extremities. Normal sensation  throughout with normal cerebellar function.   EKG reveals normal sinus rhythm, with left ventricular hypertrophy, and  repolarization abnormalities. Normal axis and normal intervals.   LABORATORY DATA:  White count 9.9, hematocrit 35, platelet count 260,000.  Sodium 139, potassium 4.9, chloride 105, bicarbonate 27, BUN 28, creatinine  1.3, glucose 97.  Total bilirubin 0.7, AST 37, ALT 126, and alkaline  phosphatase 82.  Urinalysis unremarkable.   ASSESSMENT/PLAN:  This is a 65 year old female with history of type 2  diabetes and recent bihemispheric cerebrovascular accident. While her  symptoms are robust she only has mild orthostasis without significant  azotemia. The differential diagnosis at this point includes gastroenteritis,  gastritis, diabetic  gastroparesis. We also must consider pancreatitis even  in the absence of diarrhea. Will add amylase and lipase to evaluate for  this. We will also cycle cardiac enzymes in case her nausea and vomiting is  actually an anginal equivalent given her history of prior CVA and type 2  diabetes. Will continue sliding scale insulin for the diabetes. Check a  lipid panel and a hemoglobin A1c in the morning. Should her nausea and  vomiting not resolve within a few hours will check a CAT scan in the morning  after hydration to avoid additional insult from IV dye.      Denyse Amass, MD     DBH/MEDQ  D:  04/06/2006  T:  04/06/2006  Job:  [id number]

## 2011-03-04 NOTE — Discharge Summary (Signed)
[name], [name]         ACCOUNT NO.:  [id number]   MEDICAL RECORD NO.:  [id number]          PATIENT TYPE:  INP   LOCATION:  5729                         FACILITY:  [facility]   PHYSICIAN:  Rosalyn Gess. Norins, [name] LHCDATE OF BIRTH:  1945-11-13   DATE OF ADMISSION:  04/06/2006  DATE OF DISCHARGE:  04/10/2006                                 DISCHARGE SUMMARY   ADMITTING DIAGNOSES:  1.  Refractory nausea and vomiting.  2.  History of cerebrovascular accident.  3.  Carotid artery disease.  4.  Hypertension.  5.  Diabetes.  6.  Hyperlipidemia.   DISCHARGE DIAGNOSES:  1.  Gastroparesis with nausea and vomiting.  2.  History of cerebrovascular accident.  3.  Coronary artery disease.  4.  Hypertension.  5.  Diabetes.  6.  Hyperlipidemia.   CONSULTANTS:  None.   PROCEDURES:  CT scan of the abdomen and pelvis which revealed no significant  abnormality.   HISTORY OF PRESENT ILLNESS:  The patient is 65 year old Hispanic woman with  a history of recent bihemispheric CVA.  She presented with a 3-day history  of nausea and vomiting that was refractory along with mild epigastric  discomfort.  She had had no diarrhea, no fevers, no sweats, no chills, no  chest pain, no shortness of breath, no dyspnea on exertion, PND or  orthopnea.  She had had a recent admission for bacteruria and pyuria.   PAST MEDICAL HISTORY/FAMILY HISTORY/SOCIAL HISTORY/PHYSICAL EXAM:  Please  see the H&P.   ADMITTING LABORATORY:  Significant for a normal white count of 9900, normal  hematocrit.  Chemistries were unremarkable with a creatinine of 1.3 and a  glucose of 97.  Liver functions were mildly elevated _________ of 126,  alkaline phosphatase was normal.   Admission examination was remarkable for the patient having a soft nontender  abdomen with no rebound or guarding.  Her liver was 4 cm by percussion.  Cardiovascular examination was significant for a 2/6 holosystolic murmur.   HOSPITAL COURSE:  1.   GI.  The patient with refractory nausea and vomiting.  Initially thought      this might represent gastroenteritis.  The patient's medications were      held and this did not improve the situation.  It was felt that      gastroparesis was playing a role.  The patient was started on Reglan and      seems to have done better through the night and this morning.  She had      minimal nausea, no vomiting today.  With the patient having ruled out      for any significant radiographic problems of the abdomen or pelvis with      her nausea and vomiting being better controlled, she is thought to be      stable and ready for discharge home.  2.  The patient's other medical problems did remain stable during this      hospitalization.   MEDICATIONS:  She will be continued on all of her home medications.   FOLLOWUP:  The patient will see Dr. Efrain Sella in follow-up in approximately 5  days.   FINAL LABORATORY:  The final laboratory from April 10, 2006 with a hemoglobin  10.3 grams, white count 5400 with a normal differential.  Chemistries were  unremarkable with a potassium of 4.1, creatinine of 0.8, glucose of 78.  ESR  was 37.  TSH was 0.539.   DISCHARGE EXAMINATION:  VITAL SIGNS:  Temperature was 98.4, blood pressure  176/86, pulse was 68, respirations 18.  CBG was 103.  Weight is up 7 pounds  from her admission.  GENERAL:  This is a pleasant Hispanic woman sitting in a chair in no acute  distress.  CHEST:  Chest was clear with no rales, wheezes or rhonchi.  CARDIOVASCULAR:  2+ radial pulse.  Her precordium was quiet with a regular  rate and rhythm in the sitting position.  ABDOMEN:  The patient's bowel sounds were positive.  She had no tenderness  to palpation except for minor discomfort in the left quadrant.   DISCHARGE MEDICATIONS:  As at admission to wit:  1.  Aggrenox b.i.d.  2.  Benicar 40 mg daily.  3.  Metformin 1 gram b.i.d.  4.  Triamterene hydrochlorothiazide 37.5/25 once daily.   5.  Prozac 40 mg daily.  6.  Clarinex 5 mg daily.  7.  Cartia XT 240 mg daily.  8.  Zocor 20 mg daily.  9.  Januvia 100 mg daily.  10. Darvocet p.r.n.  New medication:  1.  Reglan 10 mg a.c. and h.s.   FINAL DISPOSITION:  To discharge home.   CONDITION AT DISCHARGE:  Stable and improved.           ______________________________  Rosalyn Gess Norins, [name] [facility]     MEN/MEDQ  D:  04/10/2006  T:  04/10/2006  Job:  [id number]   cc:   Corwin Levins, [name] [facility]  [address]  [place]  [place] [address]

## 2011-03-04 NOTE — Assessment & Plan Note (Signed)
HEALTHCARE                           GASTROENTEROLOGY OFFICE NOTE   NAME:[name] [name]                  MRN:          [id number]  DATE:06/15/2006                            DOB:          May 25, 1946    REASON FOR CONSULTATION:  Abdominal pain and elevated liver tests.   HISTORY:  This is a pleasant 66 year old  female with a history of  hypertension, diabetes mellitus, dyslipidemia, chronic headaches, bi-  hemispheric strokes, and remote cholecystectomy.  She is referred through  the courtesy of Dr. Jonny Ruiz regarding problems with abdominal pain and elevated  liver function tests.  Patient established with this practice earlier this  year after moving to [facility] from [place], [place].  She does  have a remote history of cholecystectomy 30 years ago. While in [place]  she reports having undergone endoscopic evaluations.  The chart reports an  incomplete colonoscopy in 2001. She thinks she has undergone more recent  colonoscopy and upper endoscopy.  In any event, she was admitted to the  hospital in May with symptomatic bi-hemispheric strokes.  Since that time  she reports intermittent problems with fairly severe right sided abdominal  pain.  She notices it in the right upper quadrant and right mid-abdomen.  Occasionally associated with nausea and vomiting.  The pain comes on  unpredictably, she feels that certain foods such as fatty foods are  precipitants.  The pain can last for hours to all day and then abate.  She  may have several attacks per week which vary in intensity or may go as long  as several weeks without an attack.  She has had no problems with pain in  over one week.  She states the pain is very similar to problems she had  prior to her cholecystectomy.  She has had some liver function tests over  the past several months.  The tests were unremarkable in May and June,  though markedly abnormal in July when she was  hospitalized with complaints  of pain.  Specifically, her liver tests revealed transaminases in the 200-  300 range with alkaline phosphatase in the 150 range.  Bilirubin was normal.  She underwent abdominal ultrasound at that time.  This revealed the  gallbladder to be surgically absent.  No other abnormalities.  In  particular, no evidence of biliary ductal dilation.  She also underwent a CT  scan of the abdomen on two occasions in June.  Aside from fibroid uterus and  appendicoliths within a non-inflamed appendix, these were unremarkable.  Acute hepatitis serologies in July to evaluate the liver test abnormalities  were negative.  It was thought that her liver test abnormalities may be  secondary to Zocor and she was asked to stop the drug.  However she has been  on Zocor for about 10 years without change in dosage and no prior history of  abnormal liver tests related to this.  Recent liver tests May 26, 2006  without pain reveal entirely normal liver tests.  She is on Aggrenox since  her diagnosis of strokes.  She has no complaints of abdominal pain today.   PAST  MEDICAL HISTORY:  1. Hypertension.  2. Diabetes.  3. History of bi-hemispheric strokes.  4. Hyperlipidemia.  5. Chronic headaches.  6. History of anxiety with panic disorder.   PAST SURGICAL HISTORY:  1. Status post open cholecystectomy 30 years ago.  2. Status post tubal ligation.   ALLERGIES:  Patient is intolerant to CODEINE which she states causes  swelling.  So list is on here that she is intolerant to METFORMIN and ZOCOR.   CURRENT MEDICATIONS:  1. Cartia XT 240 mg daily.  2. Aspirin 81 mg daily.  3. Benicar 40 mg daily.  4. Glimepiride 1 mg daily.  5. Triamterene/hydrochlorothiazide 37.5/25 mg daily.  6. Aggrenox 25/200 mg 1 b.i.d.  7. __________  p.r.n.   FAMILY HISTORY:  [name] with unspecified liver disease.  Mother with heart  disease and diabetes.  Father and brother with cerebrovascular  disease.   SOCIAL HISTORY:  Patient is divorced with 4 children. One son is deceased  due to drowning.  She is relocated from [place] to [name] and lives  alone.  She has a 12th grade education.  Does not smoke and does not use  alcohol.   REVIEW OF SYSTEMS:  Per diagnostic evaluation form.   PHYSICAL EXAMINATION:  GENERAL:  A pleasant female in no acute distress.  VITAL SIGNS:  Blood pressure is 112/60. Heart rate is 68.  Weight is 169.8  pounds.  She is 5 feet 1 inch in height.  HEENT:  Sclerae anicteric.  Conjunctiva are pink. Oral mucosa intact.  No  adenopathy.  LUNGS:  Clear.  HEART:  Regular.  ABDOMEN:  Obese and soft without tenderness, mass or hernia.  Previous  surgical incision is well-healed.  Good bowel sounds heard.  EXTREMITIES:  Are without edema.   IMPRESSION:  1. This is a 65 year old female with multiple general medical problems who      presents with several month history of recurrent right sided abdominal      pain reminiscent of her remote problems with biliary colic. On one      occasion with pain markedly abnormal liver tests which have since      normalized.  I suspect the patient has been experiencing biliary colic      due to bile duct stone which she may or may not have passed.  2. Multiple general medical problems as listed above.  3. On Aggrenox for history of bi-hemispheric strokes.   RECOMMENDATIONS:  1. At this point I think the most prudent course of action would be to      proceed with an endoscopic ultrasound to evaluate for evidence of      choledocholithiasis.  If not present then convert to endoscopic      retrograde cholangiopancreatography with sphincterotomy in stone      extraction.  I have discussed this in detail with the patient today.      The examinations will be tentatively planned for next week with Dr.      Rob Bunting who performs the endoscopic ultrasounds here in     [place].  I have discussed this case with Dr.  Christella Hartigan in detail.  2. Additionally, we need to address the issue of her Aggrenox.  We will      consult with her neurologist Dr. Sandria Manly to see if it is acceptable to      take the patient off the drug 5-7 days prior to the examination should      biliary sphincterotomy be required  for stone extraction.  3. The patient understands the plan as outlined.                                   Wilhemina Bonito. Eda Keys., MD   JNP/MedQ  DD:  06/15/2006  DT:  06/16/2006  Job #:  [id number]   cc:   Barbette Hair. Artist Pais DO  Corwin Levins, MD  Rachael Fee, MD  Genene Churn. Love, MD

## 2011-03-04 NOTE — Discharge Summary (Signed)
NAMEAlila, [name]         ACCOUNT NO.:  [id number]   MEDICAL RECORD NO.:  [id number]          PATIENT TYPE:  INP   LOCATION:  3041                         FACILITY:  [facility]   PHYSICIAN:  Rene Paci, [name] LHCDATE OF BIRTH:  03-Feb-1946   DATE OF ADMISSION:  05/14/2006  DATE OF DISCHARGE:  05/16/2006                                 DISCHARGE SUMMARY   DISCHARGE DIAGNOSES:  1.  Right-sided headache, question migraine, resolved post morphine.  2.  Question stranding right-sided weakness and dysarthria, possible      transient ischemic attack, no acute abnormality on MRI July 29.  3.  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  Continue medical management.  4.  Nonspecific right-sided abdominal pain with nausea and vomiting,      previous history of gastroparesis.  Negative workup at this      hospitalization.  5.  Mild transaminase elevation, question statin effect.  Ultrasound      negative for acute abnormalities.  6.  Type 2 diabetes, now diet-controlled A1c of 6.5, off metformin secondary      to nausea, off hypoglycemic agents secondary to history of hypoglycemia.  7.  History of hypertension.  8.  History of dyslipidemia.  9.  History of depression and anxiety.   DISCHARGE MEDICATIONS:  Discontinuation of Zocor.  Other medications are as  prior to admission and include:   1.  Aggrenox 1 tablet b.i.d.  2.  Benicar 40 mg daily.  3.  Maxzide 37.5/25 mg one p.o. daily.  4.  Prozac 20 mg daily.  5.  Foltx one tablet p.o. daily.  6.  Aspirin 81 mg daily.  7.  Again, no Zocor, previously stopped, and Genuvia and metformin.   CONDITION ON DISCHARGE:  Medically improved though still complaining of some  nausea.  No pain, no headaches, tolerating p.o.   Hospital follow-up with primary care physician, Dr. Oliver Barre, next week,  August 8 at 10 a.m. for follow-up on nausea and recheck LFTs follow up  hepatitis screen and  consideration of evaluation for possible migraines.   HOSPITAL COURSE BY PROBLEM:  RIGHT-SIDED HEADACHE, ABDOMINAL PAIN, WITH QUESTION OF RIGHT-SIDED WEAKNESS:  The patient is a medically complex 65 year old Hispanic woman with history  of bilateral cerebral infarcts due to severe a intracranial atherosclerotic  disease, who came to the emergency room with a multitude of complaints  concerning for a new stroke.  Namely, her complaints were of right-sided  headache, right-sided abdominal pain and question right-sided weakness.  These symptoms had mostly resolved by the time of evaluation in the  emergency room without any persisting right-sided weakness, pain, and  headache had resolved post 4 mg of morphine.  Because of her history of  stroke and concern for TIA she was admitted for an MRI of her head, which  showed no acute abnormality on diffusion-weighted images.  She was evaluated  by ST, PT, OT, who also agreed that there were no new deficits compared to  her baseline, which has a subtle gait disorder, recommending use of a cane  as needed.  In the evaluation of abdominal pain, her LFTs were checked and  found to be mildly elevated transaminases, with an AST of 281 and an ALT of  160, alkaline phosphatase was 160, normal total bilirubin.  The patient had  had a previous cholecystectomy due to complaints of right-sided abdominal  pain.  She underwent an abdominal ultrasound, which showed no acute  abnormalities including a normal-size liver, spleen, no abnormality seen,  normal common bile duct of 3.5 and no infiltration or congenital changes on  the liver.  It is possible that her transaminase elevation is due to statin  effect, and thus her Zocor has been discontinued at this time.  The patient  believes she may not have been taking this at home.  Will have to check  prior to discharge her hepatitis panel.  The patient describes no exposure  to hepatitis and also is not currently  complaining of severe GI symptoms  other than low-grade nausea.  Previous diagnosis of gastroparesis, but the  patient no longer takes Reglan.  Will defer for further outpatient follow-  up, including follow-up on the hepatitis panel as well as consideration of a  gastric emptying study to her primary care physician.  We have provided her  the prescription for Phenergan to be used as needed at home as well as  continued medical management of her medications as prior to admission for  risk control other than the discontinuation of Zocor.  Of note, her fasting  lipid profile had been checked during this hospitalization and she had  reasonably good control with a total cholesterol 147, LDL of 92, HDL of 47  and triglycerides of 42.  The patient will continue Foltx vitamin supplement  once daily.      Rene Paci, [name] [facility]  Electronically Signed     VL/MEDQ  D:  05/16/2006  T:  05/16/2006  Job:  [id number]

## 2011-03-04 NOTE — Assessment & Plan Note (Signed)
[facility]                                 ON-CALL NOTE   NAME:Claycomb, Brielyn                  MRN:          [id number]  DATE:05/25/2007                            DOB:          1946/05/13    After I got off the phone with Clydie Braun, I reviewed the information on  Macrobid on [employer].  It does come with a formulary of nitrofurantoin  100 mg ER and is used for UTI suppression.  I went ahead and called  Clydie Braun back at [place], and, incidentally, she was paging me to let me  know that the patient advised her that she was getting this prescription  to suppress  UTIs.  I went ahead and told Clydie Braun that she should go ahead  and fill the prescription since it is consistent with this use in the  formulation.  Clydie Braun expressed understanding.     Leanne Chang, [name]  Electronically Signed    LA/MedQ  DD: 05/25/2007  DT: 05/26/2007  Job #: [id number]

## 2011-03-04 NOTE — Consult Note (Signed)
[name], Hachey Gabrianna         ACCOUNT NO.:  [id number]   MEDICAL RECORD NO.:  [id number]          PATIENT TYPE:  INP   LOCATION:  4729                         FACILITY:  [facility]   PHYSICIAN:  Genene Churn. Love, [name]    DATE OF BIRTH:  25-Jul-1946   DATE OF CONSULTATION:  DATE OF DISCHARGE:                                   CONSULTATION   Further information from [name] indicates that she was weak on her  right side on Mar 05, 2006, but after MRI study today she was weak on her  left side.   IMPRESSION:  1.  Headaches, code 784.0.  2.  Right-sided weakness, code 342.10 but also left-sided weakness occurring      today, code 342.10.  3.  Old left parietal cortical stroke and bicerebral small vessel strokes,      code 433.31.  4.  Carotid disease.  5.  Possible patent foramen ovale mildly based on 2-D echocardiogram.           ______________________________  Genene Churn. Sandria Manly, [name]     JML/MEDQ  D:  03/08/2006  T:  03/08/2006  Job:  [id number]

## 2011-03-04 NOTE — Discharge Summary (Signed)
[name], [name]         ACCOUNT NO.:  [id number]   MEDICAL RECORD NO.:  [id number]          PATIENT TYPE:  INP   LOCATION:  6732                         FACILITY:  [facility]   PHYSICIAN:  Gordy Savers, [name] LHCDATE OF BIRTH:  May 14, 1946   DATE OF ADMISSION:  03/16/2006  DATE OF DISCHARGE:  03/21/2006                                 DISCHARGE SUMMARY   CHIEF COMPLAINT:  Abdominal pain.   HISTORY OF PRESENT ILLNESS:  Patient is a 65 year old Hispanic female who  was admitted with increasing right-sided weakness, headache, unsteadiness of  gait and being off balance.  She has a prior history of cerebrovascular  disease.  She presented to the emergency room with complaint of increasing  suprapubic and lower abdominal pain.  Patient has a prior history of  cerebrovascular disease and the prior week had been complaining of some  right-sided headaches and right-sided weakness.  Patient had been followed  by neurology and started on Aggrenox.  An MRA revealed severe vascular  abnormalities.   PAST MEDICAL HISTORY:  1.  History of bihemispheric cerebrovascular accidents in May of 2007.  2.  Carotid vascular disease.  3.  Hypertension.  4.  Type 2 diabetes.  5.  Hypercholesterolemia.  6.  History of anxiety/depression.  7.  Asthma and allergy problems.   PREADMISSION MEDICATIONS:  1.  Aggrenox one b.i.d.  2.  Metformin 1 g b.i.d.  3.  Diazide one daily.  4.  Cartia XT 240 daily.  5.  Prozac 40 mg daily.  6.  Benicar 40 daily.  7.  Zocor 20 daily.  8.  Jenuvia 100 mg daily.  9.  Darvocet-N 100 p.r.n.   LABORATORY DATA AND HOSPITAL COURSE:  Patient was admitted to the hospital  where she was placed on IV fluids and laboratory studies obtained.  She was  seen in consultation by Dr. Fabienne Bruns for her possible symptomatic  carotid stenosis.  Radiologic studies included a CT of the abdomen and  pelvis that revealed no acute abnormalities.  Electrocardiogram  revealed  normal sinus rhythm, LVH and otherwise normal findings.   Laboratory studies revealed pyuria as well as bacteriuria.  The patient was  placed on Cipro and over a couple of days, improved.  Preadmission lab  included azotemia with creatinine of 2.5.  With careful hydration, renal  indices normalized.  Prior to her discharge, white count was normal at 7.1.  BUN and creatinine were 8 and 1, respectively.   On hospital admission, her metformin was held and she received coverage with  sliding scale regimen of short-acting insulin.  She was maintained on her  preadmission medications as well as initially treated with additional  Lovenox.   At the time of discharge, the patient was asymptomatic, ambulatory and  afebrile and had no recurrent abdominal pain.   DISPOSITION:  The patient was discharged today to complete five additional  days of Cipro.  She will be discharged on her preadmission medications.  She  has been asked to follow up with her primary care [name] within the next  week.   CONDITION ON DISCHARGE:  Stable.  ______________________________  Gordy Savers, [name] [facility]     PFK/MEDQ  D:  03/21/2006  T:  03/21/2006  Job:  [id number]

## 2011-03-04 NOTE — Discharge Summary (Signed)
NAMEKassidi, [name]         ACCOUNT NO.:  [id number]   MEDICAL RECORD NO.:  [id number]          PATIENT TYPE:  INP   LOCATION:  4729                         FACILITY:  [facility]   PHYSICIAN:  Bruce Rexene Edison. Swords, [name] [facility] OF BIRTH:  26-Jan-1946   DATE OF ADMISSION:  03/06/2006  DATE OF DISCHARGE:  03/11/2006                                 DISCHARGE SUMMARY   DISCHARGE DIAGNOSIS:  1.  Bi-hemispheric strokes.  2.  Carotid vascular disease.  3.  Hypertension.  4.  Diabetes.  5.  Hyperlipidemia.  6.  Depression.   DISCHARGE MEDICATIONS:  1.  Aggrenox 1 tablet p.o. daily for two weeks then 1 p.o. b.i.d.  2.  Aspirin 81 mg p.o. daily for two weeks.  3.  Foltx 1 p.o. daily.  4.  Metformin 500 mg 2 p.o. b.i.d.  5.  Dyazide 1 p.o. daily.  6.  Cartia XT 240 mg p.o. daily.  7.  Prozac 300 mg p.o. daily.  8.  Benicar 40 mg p.o. daily.  9.  Zocor 20 mg p.o. daily.   PROCEDURES PERFORMED:  1.  MRI of the brain demonstrated chronic ischemic changes, there is      occlusion of the right M1 segment of the middle cerebral artery,      irregular high grade stenosis of the left M1 segment.  2.  Chest x-ray with no active disease.  3.  TEE on Mar 09, 2006, did not demonstrate any thrombus.   CONDITION ON DISCHARGE:  Improved, the patient is ambulating without  difficulty.   FOLLOW UP:  Dr. Artist Pais in 1-2 weeks.  Dr. Pearlean Brownie in two months.   CONSULTATIONS:  1.  Neurology.  2.  CVTS.  3.  Cardiology for TEE.   HOSPITAL COURSE:  The patient was admitted to the hospital service on Mar 07, 2006, see Dr. Raphael Gibney note for details.  She presented with right sided  weakness.  She was admitted with the possibility of a stroke.  Neurology was  consulted and diagnosed bi-hemispheric strokes, both old and new.  TEE was  performed which did not demonstrate any evidence of a patent foramen ovale.  Carotid vascular studies were performed with 60% stenosis.  Dr. Darrick Penna  recommended follow up carotid  vascular studies.   Other medical problems including hypertension, hyperlipidemia, and diabetes  were stable and are appropriate for outpatient follow up.  The patient will  follow up with Dr. Artist Pais in 1-2 weeks.  All of the above was discussed with  the patient and her son and all their questions were answered.      Bruce Rexene Edison Swords, [name] [facility]  Electronically Signed     BHS/MEDQ  D:  03/11/2006  T:  03/11/2006  Job:  [id number]   cc:   Thomos Lemons, D.O. [facility]  [address] [place], [place] [address]

## 2011-03-04 NOTE — H&P (Signed)
NAMEKhristi, Schiller [name]         ACCOUNT NO.:  [id number]   MEDICAL RECORD NO.:  [id number]          PATIENT TYPE:  INP   LOCATION:  1824                         FACILITY:  [facility]   PHYSICIAN:  Corwin Levins, [name] LHCDATE OF BIRTH:  01/24/1946   DATE OF ADMISSION:  03/16/2006  DATE OF DISCHARGE:                                HISTORY & PHYSICAL   CHIEF COMPLAINT:  Abdominal pain increasing today with questionable  increasing right-sided weakness, headache, unsteadiness, and off-balance.   HISTORY OF PRESENT ILLNESS:  [name] is a 65 year old Hispanic female  who is here with the above.  She felt better after being in the ER.  Her  main complaint is suprapubic and lower abdominal pain worsening today  associated with questionable neurological symptoms as above.  She was just  recently hospitalized last week with atypical stroke it appears with a right-  sided headache and right-sided weakness.  There was no acute CVA by the MRI  report.  By my interpretation, there are old ischemic changes, but  apparently is felt to have had atypical CVA per neurology.  She was seen per  neurology, started on aspirin and Aggrenox after MRA showed severe vascular  abnormalities.  She has 100% right M1 segment MCA occlusion, and severe  stenosis of the left M1 segment of the MCA as well.   PAST MEDICAL HISTORY:  1.  CVA as above.  2.  Carotid vascular disease as above.  3.  Hypertension.  4.  Diabetes.  5.  Hypercholesterolemia.  6.  Allergies.  7.  Asthma.  8.  Depression with obesity.   ALLERGIES:  CODEINE.   SOCIAL HISTORY:  No tobacco, no alcohol.  She has an attentive daughter.   FAMILY HISTORY:  CVA.   REVIEW OF SYSTEMS:  Below and noncontributory.   CURRENT MEDICATIONS:  1.  Aggrenox one p.o. b.i.d.  2.  Aspirin 83 mg p.o. daily x2 weeks.  3.  Foltx one p.o. daily.  4.  Metformin 500 mg two p.o. b.i.d.  5.  Diazide one p.o. daily.  6.  Cartia XT 240 mg p.o. daily.  7.   Prozac 40 mg p.o. daily.  8.  Benicar 40 mg p.o. daily.  9.  Zocor 20 mg p.o. daily.  10. Junuvia 100 mg p.o. daily.  11. Darvocet p.r.n.   PHYSICAL EXAMINATION:  GENERAL:  [name] is a very nice, alert, 60-year-  old Hispanic female.  VITAL SIGNS:  Afebrile.  Blood pressure 137/68, heart rate 91, respirations  22, O2 saturation 99%.  ENT:  Sclerae clear.  TMs clear.  Pharynx benign.  NECK:  Without lymphadenopathy, JVD, thyromegaly.  CHEST:  No rales or wheezing.  ABDOMEN:  Soft and nontender except for moderate suprapubic tenderness.  No  guarding or rebound.  EXTREMITIES:  No edema.  NEUROLOGIC:  Mild right-sided weakness only, otherwise, no focal changes.   LABORATORY DATA:  On Mar 16, 2006:  CPK-MB 1.3, troponin-I less than 0.05.  ECG not on the chart.  UA with 7 to 10 white blood cells, many bacteria,  positive leukocyte esterase.  Electrolytes with sodium of 133,  BUN 44,  creatinine 2.5, glucose 126.  White blood cell count 10.6, hemoglobin 12.7.   ASSESSMENT AND PLAN:  1.  Abdominal pain consistent with cystitis, possible pyelonephritis with      renal insufficiency.  She will be treated with intravenous Cipro after      urine culture, given intravenous fluids with the elevated creatinine,      and recheck culture results and creatinine in the a.m.  2.  Headache with extremity weakness and unsteadiness.  Questionably worse      by the daughter, but unclear how significant this is given the problem      #1.  We will continue home medications as is.  Will need neurological      consultation to clarify, but I suspect neurologically she has had no      change and may not need further CNS studies at this time.  3.  Diabetes mellitus.  Check CBG's and sliding scale insulin.  Hold the      Metformin with increase in creatinine.  Otherwise, continue home      medications.  4.  Other medical problems.  Otherwise, continue home medications.            ______________________________  Corwin Levins, [name] [facility]     JWJ/MEDQ  D:  03/16/2006  T:  03/16/2006  Job:  [id number]   cc:   Dr. __________

## 2011-03-04 NOTE — H&P (Signed)
[name], Labarbera [name]         ACCOUNT NO.:  [id number]   MEDICAL RECORD NO.:  [id number]          PATIENT TYPE:  INP   LOCATION:  4729                         FACILITY:  [facility]   PHYSICIAN:  Corwin Levins, [name] LHCDATE OF BIRTH:  [birth year]   DATE OF ADMISSION:  03/06/2006  DATE OF DISCHARGE:                                HISTORY & PHYSICAL   CHIEF COMPLAINT:  Right-sided headed with right-sided weakness.   HISTORY OF PRESENT ILLNESS:  [name] is a 65 year old Hispanic female  here with her daughter. There has been sudden onset about 36-hours of right-  sided weakness associated with right-sided headed. It seems somewhat  migrainous in character in that it is somewhat throbbing but no other  symptoms such as photophobia, phonophobia, nausea, vomiting or other  symptoms. The pain was 10/10 yesterday but now 5/10 today. It has been  associated at the same with onset of weakness of the right upper and lower  extremities unclear etiology. No history of stroke or other CNS problems  before this time. There is no fever or trauma. Of note is that she just  started Januvia 100 mg samples 4 days ago.   PAST MEDICAL HISTORY:  1.  Diabetes mellitus.  2.  Hypercholesterolemia.  3.  Hypertension.  4.  Obesity.  5.  Allergies.  6.  Asthma.  7.  Depression.   CURRENT MEDICATIONS:  1.  Metformin 500 mg 2 p.o. b.i.d.  2.  Clarinex 5 mg p.o. daily.  3.  Diazide 37.5/25 1 p.o. daily.  4.  Cartia XT 240 mg p.o. daily.  5.  Prozac 40 mg p.o. daily.  6.  Benicar 40 mg p.o. daily.  7.  Zocor 40 mg p.o. daily.  8.  Januvia 100 mg p.o. daily.   SOCIAL HISTORY:  No tobacco, no alcohol.   FAMILY HISTORY:  Significant for stroke.   REVIEW OF SYSTEMS:  She was ordered a stress test last week for unclear  reasons but not yet done. All other systems noncontributory.   PHYSICAL EXAMINATION:  GENERAL:  [name] is a 65 year old female.  VITAL SIGNS:  Blood pressure 101/63, pulse 79,  temperature 97.5, weight 195.  ENT:  Sclera clear. TMs clear. Pharynx benign.  NECK:  Without lymphadenopathy, JVD or thyromegaly.  CHEST:  No rales or wheezes.  CARDIAC:  Regular rate and rhythm, no murmur.  ABDOMEN:  Soft, nontender, positive bowel sounds. No organomegaly or masses.  EXTREMITIES:  No edema.  NEUROLOGIC:  Cranial nerves II-XII intact otherwise nonfocal except for  obvious 3+ to 4/5 right upper and right lower extremity weakness.   ASSESSMENT/PLAN:  1.  Right-sided weakness with moderate to severe right-sided head pain in      the temperoparietal area it seems. She is to be admitted with suspicion      of significant CNS abnormality such as mass, acute CNS bleed, arteritis      or CVA. Will check head CT without contrast and sedimentation rate as      well as routine labs. Place her on telemetry, check 2-D echocardiogram,      carotid Dopplers. Consider  head MRI. Also consider neurology versus      neurosurgery consult.  2.  Diabetes mellitus. Check CBGs, apply sliding scale insulin. Continue      home medications as is.  3.  Hypertension otherwise stable. Continue home medications.  4.  Other medical problems otherwise continue home medications.           ______________________________  Corwin Levins, [name] [facility]     JWJ/MEDQ  D:  03/06/2006  T:  03/06/2006  Job:  [id number]   cc:   Thomos Lemons, D.O. [facility]  [address] [place], [place] [address]

## 2011-03-04 NOTE — Procedures (Signed)
EEG NUMBER:  [id number].   This is a 65 year old [place] female seen in consultation by Dr. Avie Echevaria  of Mar 07, 2006.  EEG recorded on Mar 09, 2006.  This is a routine EEG  performed in the EEG lab.  The patient is currently residing at room 4729 at  [facility].  Is described as awake and drowsy.  Photic  stimulation and hyperventilation was performed in this 16 channel EEG  recording was one channel representing heart rate and rhythm exclusively.   The patient's medications were not listed on the intake sheet, but she is  apparently diabetic on metformin, Claritin, Maxzide, Cardizem, Tiazac,  Prozac, Avapro, Zocor and insulin.   The last meal was breakfast.   DESCRIPTION OF PROCEDURE:  A slow 7 Hz rhythm amidst bilaterally  symmetrically thrombosed posterior hemispheres and promptly attenuates with  eye opening.  There is normal amplitude noted bilaterally.  No epileptiform  discharges are seen and no focal slowing is noted.  The patient was able to  comply with hyperventilation, but did not show a significant increase in  amplitude. As the study progressed, there were intermittent drowsy features  noted, such as Theta frequency central dominant rhythms but sleep stages  were not reached.  Photic stimulation did not lead to photic entrainment but  to excessive motion artifact with eye blinking.   This is a borderline slow EEG given the patient's age of only 33 years, no  epileptiform discharges were noted.  I would rate this EEG due to the  generalized slowing still has abnormal.      Melvyn Novas, [name]  Electronically Signed     ZO:XWRU  D:  03/09/2006 17:21:06  T:  03/10/2006 10:52:11  Job #:  [id number]   cc:   Corwin Levins, [name] [facility]  [address]  [place]  [place] [address]

## 2011-03-04 NOTE — Consult Note (Signed)
NAMEVanita, [name]         ACCOUNT NO.:  [id number]   MEDICAL RECORD NO.:  [id number]          PATIENT TYPE:  INP   LOCATION:  4729                         FACILITY:  [facility]   PHYSICIAN:  [name], [name]    DATE OF BIRTH:  08/04/1946   DATE OF CONSULTATION:  03/08/2006  DATE OF DISCHARGE:                                   CONSULTATION   NEUROLOGICAL CONSULTATION:   REFERRING PHYSICIAN:  Dr. Oliver [name]   HISTORY:  This 65 year old right-handed [place] divorced female was  admitted Mar 06, 2006 with a history of right-sided headache without a prior  history of headache.  This began on Sunday, Mar 07, 2006 and was described  as a sharp pain, increased with movement and  also was noted to have a full  sensation in her right arm and right arm weakness.  This resolved but then  recurred today after an MRI study was performed.  Again she developed  recurrent right-sided headache and right arm weakness.  She had a paralyzed  feeling all over.  It is not clear.  The patient says right side but  nurses notes indicate that this was on the left side.  The patient has a  known prior history of diabetes for 6 years and hypertension for 12 years.  She said she had a heart murmur.   PAST MEDICAL HISTORY:  Her past medical history is significant depression,  asthma and allergies.   MEDICINES INCLUDE:  1.  Metformin 500 mg two p.o. b.i.d.  2.  Triamterene/hydrochlorothiazide 37.5/25 daily.  3.  Benicar 40 mg daily.  4.  Fluoxetine 40 mg daily.  5.  __________ XT 240 mg daily.  6.  Zocor 40 mg daily.  7.  Clarinex 5 mg p.o. daily.   She is not on aspirin.   SOCIAL HISTORY:  She does not smoke cigarettes.  She has no known history of  drug use.   EXAMINATION:  Well-developed female in no acute distress.  Blood pressure  right arm was 200/90, left arm was 190/90, heart rate was 68.  There were no  bruits.  Mental status:  She is alert, oriented x3.  Her cranial nerve  examination revealed visual fields full, disks flat, extraocular movements  full, cornealis present, facial sensation equal, no facial motor asymmetry.  Motor examination revealed significant giving way of the right hand and arm,  also in the right leg, with a positive Hoover sign.  Sensory examination was  intact to pinprick, light touch, pin position , vibration testing.  Deep  tendon reflexes were 2+.  Plantar responses were downgoing.  Gait was  normal.   LABORATORY DATA:  Comprehensive metabolic panel was normal.  PT was 12.7.  INR was 0.9.  White blood cell count was 9900, hemoglobin 13.1, hematocrit  39.7, platelet count 314.  Sed rate was high at 30.  TSH was 1.08, LDL was  high at 115.  EKG revealed normal sinus rhythm.  CT scan showed an old left  parietal stroke and old right frontal stroke.  MRI showed an old left  parietal and bilateral small vessel  ischemic strokes, she had an occluded  right MCA and significant stenosis of her left MCA.  A Doppler study showed  40-60% distal stenosis bilaterally and 60-80% stenosis in the left proximal  internal carotid.   IMPRESSION:  1.  Headaches (code 784.0).  2.  Right-sided weakness (code 342.10).  3.  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  4.  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  5.  Carotid disease by Doppler studies (code 433.10)   PLAN:  Plan at this time is to obtain an EEG to rule out seizures, also  switch her to Aggrenox.  The nurses notes indicate that she was weak on her  left side but the patient tells me she is weak on her right side.  In any  case, she has bicerebral disease which in either side could be affected with  the results of the middle cerebral artery findings on MRA.           ______________________________  [name] Churn. Sandria Manly, [name]     JML/MEDQ  D:  03/08/2006  T:  03/08/2006  Job:  [id number]   cc:   [name], [name] [facility]  [address]  [place]  [place] [address]

## 2011-03-04 NOTE — Assessment & Plan Note (Signed)
[facility]                           GASTROENTEROLOGY OFFICE NOTE   NAME:[name], [name]                  MRN:          [id number]  DATE:07/24/2006                            DOB:          [birth year]    HISTORY:  [name] presents today for a followup.  She is accompanied by  her daughter, Kennon Rounds.  She is a 65 year old with a history of hypertension,  diabetes, dyslipidemia, chronic headaches, bi-hemispheric strokes, and  remote cholecystectomy.  She was elevated June 15, 2006 for abdominal pain  and elevated liver tests.  Liver tests May 26, 2006, however, were  normal.  She was felt possibly to have choledocholithiasis.  As such, she  underwent endoscopic ultrasound with Dr. Rob Bunting.  The examination  revealed normal common bile duct with no evidence of filling defects.  The  maximal bile duct diameter was 5.4 mm.  No other abnormalities found.  Because of her prior history of elevated transaminases ERCP was attempted,  but access to the common duct failed.  It was presumed that the patient may  have passed a small stone and observation indicated.  Since that time she  remains asymptomatic with no further recurrent abdominal pain.  She  complains of insomnia, for which I have referred her to her primary care  physician.  They also asked for clearance for eye surgery.  They state they  have had clearance from the primary care physician but wanted GI clearance  as well.  I felt there would be no problem from a GI standpoint for cataract  surgery.   CURRENT MEDICATIONS:  Unchanged from last visit.   PHYSICAL EXAM:  This is a well-appearing female in no acute distress.  Blood pressure 100/50, heart rate 76, weight 166 pounds.  HEENT:  Sclerae are anicteric.  ABDOMEN:  Soft without tenderness, mass, or hernia.  Good bowel sounds  heard.  Prior cholecystectomy incisions well healed.   IMPRESSION:  1. Prior problems with abdominal  pain and transient elevation of liver      tests possibly due to past common duct stone.  Currently asymptomatic      with normal liver tests and negative endoscopic ultrasound.  2. Multiple general medical problems.   RECOMMENDATIONS:  1. Expectant management from a GI perspective.  She should return for      recurrent abdominal      pain or liver test abnormalities.  2. Return to the general medical care of Dr. Thomos Lemons.            ______________________________  Wilhemina Bonito. Eda Keys., MD      JNP/MedQ  DD:  07/24/2006  DT:  07/24/2006  Job #:  [id number]   cc:   Barbette Hair. Artist Pais, DO

## 2011-03-04 NOTE — Assessment & Plan Note (Signed)
[facility]                                 ON-CALL NOTE   NAME:Wiles, Ferne                  MRN:          [id number]  DATE:05/25/2007                            DOB:          [birth year]    PRIMARY CARE PHYSICIAN:  Dr. Artist Pais.   Clydie Braun from [facility] called in stating that a prescription was written for  Ms. Derrell Lolling.  The prescription was for Macrodantin ER 100 mg 1 tablet  daily for 30 days.  Clydie Braun wanted to confirm whether this was the  medication being called in or if Dr. Artist Pais wanted a different medication.  She reported that the dose was twice a day versus once a day,  additionally is written for 30 days.  I advised Clydie Braun that I could not  confirm the medication written.  I concurred with Clydie Braun that she should  wait until Monday to confirm the medication.     Leanne Chang, [name]  Electronically Signed    LA/MedQ  DD: 05/25/2007  DT: 05/26/2007  Job #: [id number]

## 2011-07-29 LAB — CBC
HCT: 39.1
MCHC: 34
MCV: 90.1
MCV: 90.8
Platelets: 231
RBC: 4.3
RDW: 12.4
WBC: 8

## 2011-07-29 LAB — I-STAT 8, (EC8 V) (CONVERTED LAB)
Acid-Base Excess: 1
Bicarbonate: 25.9 — ABNORMAL HIGH
Chloride: 102
pCO2, Ven: 43.4 — ABNORMAL LOW
pH, Ven: 7.384 — ABNORMAL HIGH

## 2011-07-29 LAB — URINALYSIS, ROUTINE W REFLEX MICROSCOPIC
Bilirubin Urine: NEGATIVE
Hgb urine dipstick: NEGATIVE
Hgb urine dipstick: NEGATIVE
Nitrite: NEGATIVE
Protein, ur: NEGATIVE
Specific Gravity, Urine: 1.01
Specific Gravity, Urine: 1.018
Urobilinogen, UA: 0.2
Urobilinogen, UA: 0.2

## 2011-07-29 LAB — URINE CULTURE: Colony Count: 30000

## 2011-07-29 LAB — BASIC METABOLIC PANEL
Chloride: 102
GFR calc Af Amer: 44 — ABNORMAL LOW
Potassium: 3.6

## 2011-07-29 LAB — POCT CARDIAC MARKERS
CKMB, poc: <1 — ABNORMAL LOW
Myoglobin, poc: 116
Operator id: 294501

## 2011-07-29 LAB — DIFFERENTIAL
Basophils Absolute: 0
Basophils Relative: 0
Eosinophils Absolute: 0.2
Monocytes Relative: 7
Neutrophils Relative %: 81 — ABNORMAL HIGH

## 2011-07-29 LAB — URINE MICROSCOPIC-ADD ON

## 2011-07-29 LAB — HEMOGLOBIN A1C
Hgb A1c MFr Bld: 6.2 — ABNORMAL HIGH
Mean Plasma Glucose: 143

## 2011-07-29 LAB — TROPONIN I: Troponin I: 0.02

## 2011-07-29 LAB — POCT I-STAT CREATININE
Creatinine, Ser: 0.9
Operator id: 294501

## 2013-07-08 ENCOUNTER — Encounter (HOSPITAL_COMMUNITY): Payer: Self-pay

## 2013-07-08 ENCOUNTER — Emergency Department (INDEPENDENT_AMBULATORY_CARE_PROVIDER_SITE_OTHER)
Admission: EM | Admit: 2013-07-08 | Discharge: 2013-07-08 | Disposition: A | Discharge status: Home / Self Care | Payer: Medicare Other | Source: Home / Self Care | Attending: Family Medicine | Admitting: Family Medicine

## 2013-07-08 DIAGNOSIS — E119 Type 2 diabetes mellitus without complications: Secondary | ICD-10-CM

## 2013-07-08 HISTORY — DX: Essential (primary) hypertension: I10

## 2013-07-08 HISTORY — DX: Type 2 diabetes mellitus without complications: E11.9

## 2013-07-08 HISTORY — DX: Cerebral infarction, unspecified: I63.9

## 2013-07-08 LAB — POCT I-STAT, CHEM 8
BUN: 19 mg/dL (ref 6–23)
Chloride: 106 mEq/L (ref 96–112)
Creatinine, Ser: 1 mg/dL (ref 0.50–1.10)
HCT: 40 % (ref 36.0–46.0)
Potassium: 4.3 mEq/L (ref 3.5–5.1)
Sodium: 144 mEq/L (ref 135–145)
TCO2: 27 mmol/L (ref 0–100)

## 2013-07-08 LAB — POCT URINALYSIS DIP (DEVICE)
Bilirubin Urine: NEGATIVE
Ketones, ur: NEGATIVE mg/dL
Protein, ur: NEGATIVE mg/dL
Specific Gravity, Urine: 1.015 (ref 1.005–1.030)

## 2013-07-08 MED ORDER — METFORMIN HCL 500 MG PO TABS: 500.0000 mg | ORAL_TABLET | Freq: Two times a day (BID) | ORAL | Status: DC

## 2013-07-08 NOTE — ED Notes (Signed)
family concerned about poss UTI, as she has been having frequent urination

## 2013-07-08 NOTE — ED Provider Notes (Signed)
CSN: [id number]     Arrival date & time 07/08/13  1428 History   First MD Initiated Contact with Patient 07/08/13 1552     Chief Complaint  Patient presents with  . Urinary Tract Infection   (Consider location/radiation/quality/duration/timing/severity/associated sxs/prior Treatment) Patient is a 67 y.o. female presenting with diabetes problem. The history is provided by the patient and a relative.  Diabetes This is a chronic problem. Episode onset: here visiting from [place], s/p cva and dm, medication noncompliance, , here with daily ha and urinary freq. The problem has been gradually worsening. Associated symptoms include headaches. Pertinent negatives include no chest pain and no abdominal pain.    Past Medical History  Diagnosis Date  . Hypertension   . Diabetes mellitus without complication   . Stroke    History reviewed. No pertinent past surgical history. No family history on file. History  Substance Use Topics  . Smoking status: Never Smoker   . Smokeless tobacco: Not on file  . Alcohol Use: No   OB History   Grav Para Term Preterm Abortions TAB SAB Ect Mult Living                 Review of Systems  Constitutional: Negative.   Respiratory: Negative.   Cardiovascular: Negative for chest pain.  Gastrointestinal: Negative.  Negative for abdominal pain.  Genitourinary: Positive for frequency. Negative for urgency, vaginal discharge and pelvic pain.  Neurological: Positive for headaches.    Allergies  Codeine  Home Medications   Current Outpatient Rx  Name  Route  Sig  Dispense  Refill  . olmesartan (BENICAR) 5 MG tablet   Oral   Take 5 mg by mouth daily.         . sitaGLIPtin (JANUVIA) 100 MG tablet   Oral   Take 100 mg by mouth daily.         Marland Kitchen topiramate (TOPAMAX) 100 MG tablet   Oral   Take 100 mg by mouth 2 (two) times daily.         Marland Kitchen venlafaxine (EFFEXOR) 75 MG tablet   Oral   Take 75 mg by mouth 2 (two) times daily.         . metFORMIN  (GLUCOPHAGE) 500 MG tablet   Oral   Take 1 tablet (500 mg total) by mouth 2 (two) times daily with a meal.   60 tablet   1    BP 137/51  Pulse 69  Temp(Src) 98.3 F (36.8 C) (Oral)  Resp 12  SpO2 100% Physical Exam  Nursing note and vitals reviewed. Constitutional: She appears well-developed and well-nourished. No distress.  HENT:  Head: Normocephalic.  Eyes: Conjunctivae are normal. Pupils are equal, round, and reactive to light.  Neck: Normal range of motion. Neck supple.  Cardiovascular: Normal rate and regular rhythm.   Pulmonary/Chest: Breath sounds normal.  Abdominal: Soft. Bowel sounds are normal. She exhibits no distension. There is no tenderness. There is no rebound and no guarding.  Musculoskeletal: She exhibits no edema.  Neurological: She is alert.  Skin: Skin is warm and dry.    ED Course  Procedures (including critical care time) Labs Review Labs Reviewed  POCT I-STAT, CHEM 8 - Abnormal; Notable for the following:    Glucose, Bld 105 (*)    All other components within normal limits  POCT URINALYSIS DIP (DEVICE)   Imaging Review No results found.  MDM      Linna Hoff, MD 07/08/13 1729

## 2017-03-28 ENCOUNTER — Other Ambulatory Visit: Payer: Self-pay

## 2017-03-28 ENCOUNTER — Ambulatory Visit (INDEPENDENT_AMBULATORY_CARE_PROVIDER_SITE_OTHER): Payer: Medicare Other | Admitting: Physician Assistant

## 2017-03-28 ENCOUNTER — Encounter: Payer: Self-pay | Admitting: Physician Assistant

## 2017-03-28 VITALS — BP 156/82 | HR 68 | Temp 97.5°F | Ht 61.0 in | Wt 176.0 lb

## 2017-03-28 DIAGNOSIS — R32 Unspecified urinary incontinence: Secondary | ICD-10-CM | POA: Diagnosis not present

## 2017-03-28 DIAGNOSIS — Z86718 Personal history of other venous thrombosis and embolism: Secondary | ICD-10-CM | POA: Diagnosis not present

## 2017-03-28 DIAGNOSIS — I10 Essential (primary) hypertension: Secondary | ICD-10-CM | POA: Diagnosis not present

## 2017-03-28 DIAGNOSIS — E1169 Type 2 diabetes mellitus with other specified complication: Secondary | ICD-10-CM | POA: Diagnosis not present

## 2017-03-28 DIAGNOSIS — E119 Type 2 diabetes mellitus without complications: Secondary | ICD-10-CM

## 2017-03-28 DIAGNOSIS — R350 Frequency of micturition: Secondary | ICD-10-CM

## 2017-03-28 DIAGNOSIS — Z8673 Personal history of transient ischemic attack (TIA), and cerebral infarction without residual deficits: Secondary | ICD-10-CM | POA: Diagnosis not present

## 2017-03-28 DIAGNOSIS — E785 Hyperlipidemia, unspecified: Secondary | ICD-10-CM

## 2017-03-28 DIAGNOSIS — R413 Other amnesia: Secondary | ICD-10-CM

## 2017-03-28 LAB — BAYER DCA HB A1C WAIVED: HB A1C (BAYER DCA - WAIVED): 6.2 % (ref <7.0)

## 2017-03-28 MED ORDER — BLOOD GLUCOSE MONITOR KIT: PACK | 0 refills | Status: AC

## 2017-03-28 MED ORDER — BLOOD GLUCOSE MONITOR KIT
PACK | 0 refills | Status: DC
Start: 2017-03-28 — End: 2017-03-28

## 2017-03-28 MED ORDER — BLOOD GLUCOSE MONITOR KIT: PACK | 0 refills | Status: DC

## 2017-03-28 MED ORDER — DONEPEZIL HCL 10 MG PO TABS: 10.0000 mg | ORAL_TABLET | Freq: Every day | ORAL | 1 refills | Status: DC

## 2017-03-28 NOTE — Patient Instructions (Signed)
Normal-Pressure Hydrocephalus Normal-pressure hydrocephalus (NPH) is a buildup of fluid (cerebrospinal fluid [CSF]) inside the brain. CSF is normal within the brain, but too much fluid can affect your brain's function. NPH commonly occurs in people older than 70 years of age. What are the causes? The cause of NPH is not always known. It can be caused by anything that blocks the flow of CSF. What are the signs or symptoms? Since many symptoms of NPH are also associated with conditions sometimes seen in older people, taking care to note changes in behavior and mental function is important. Symptoms of NPH may include:  Difficulty walking, such as: ? Problems when beginning to walk. ? Feet being "frozen" to the floor. ? Shuffling feet when walking. ? Unsteadiness.  Problems with bowel and bladder control.  Memory problems such as forgetfulness, lack of concentration, dull mood, or confusion.  How is this diagnosed?  A medical history and physical exam will be done. A physical exam can reveal walking (gait) changes. Reflexes may be increased in the lower legs.  Tests can include: ? Lumbar puncture (spinal tap). ? CT scan of your head. ? MRI scan of your head. How is this treated? NPH is treated by having surgery to place a ventriculoperitoneal shunt in the brain. The ventriculoperitoneal shunt drains the excess CSF. This information is not intended to replace advice given to you by your health care provider. Make sure you discuss any questions you have with your health care provider. Document Released: 02/17/2014 Document Revised: 05/30/2016 Document Reviewed: 10/08/2013 Elsevier Interactive Patient Education  2017 [employer]  

## 2017-03-29 LAB — LIPID PANEL
CHOL/HDL RATIO: 2.3 ratio (ref 0.0–4.4)
Cholesterol, Total: 171 mg/dL (ref 100–199)
HDL: 74 mg/dL (ref >39)
LDL CALC: 89 mg/dL (ref 0–99)
Triglycerides: 40 mg/dL (ref 0–149)
VLDL Cholesterol Cal: 8 mg/dL (ref 5–40)

## 2017-03-29 LAB — CMP14+EGFR
ALBUMIN: 3.9 g/dL (ref 3.5–4.8)
ALT: 20 IU/L (ref 0–32)
AST: 22 IU/L (ref 0–40)
Albumin/Globulin Ratio: 1.4 (ref 1.2–2.2)
Alkaline Phosphatase: 91 IU/L (ref 39–117)
BUN/Creatinine Ratio: 15 (ref 12–28)
BUN: 12 mg/dL (ref 8–27)
Bilirubin Total: 0.3 mg/dL (ref 0.0–1.2)
CALCIUM: 9.2 mg/dL (ref 8.7–10.3)
CO2: 25 mmol/L (ref 20–29)
CREATININE: 0.78 mg/dL (ref 0.57–1.00)
Chloride: 101 mmol/L (ref 96–106)
GFR calc Af Amer: 89 mL/min/{1.73_m2} (ref >59)
GFR, EST NON AFRICAN AMERICAN: 77 mL/min/{1.73_m2} (ref >59)
GLUCOSE: 99 mg/dL (ref 65–99)
Globulin, Total: 2.7 g/dL (ref 1.5–4.5)
Potassium: 3.7 mmol/L (ref 3.5–5.2)
Sodium: 141 mmol/L (ref 134–144)
Total Protein: 6.6 g/dL (ref 6.0–8.5)

## 2017-03-29 LAB — CBC WITH DIFFERENTIAL/PLATELET
Basophils Absolute: 0 10*3/uL (ref 0.0–0.2)
Basos: 0 %
EOS (ABSOLUTE): 0.1 10*3/uL (ref 0.0–0.4)
EOS: 2 %
HEMATOCRIT: 40.9 % (ref 34.0–46.6)
HEMOGLOBIN: 13.6 g/dL (ref 11.1–15.9)
Immature Grans (Abs): 0 10*3/uL (ref 0.0–0.1)
Immature Granulocytes: 0 %
LYMPHS ABS: 2.1 10*3/uL (ref 0.7–3.1)
LYMPHS: 28 %
MCH: 29.4 pg (ref 26.6–33.0)
MCHC: 33.3 g/dL (ref 31.5–35.7)
MCV: 88 fL (ref 79–97)
Monocytes Absolute: 0.4 10*3/uL (ref 0.1–0.9)
Monocytes: 5 %
Neutrophils Absolute: 4.9 10*3/uL (ref 1.4–7.0)
Neutrophils: 65 %
Platelets: 228 10*3/uL (ref 150–379)
RBC: 4.63 x10E6/uL (ref 3.77–5.28)
RDW: 13.6 % (ref 12.3–15.4)
WBC: 7.6 10*3/uL (ref 3.4–10.8)

## 2017-03-29 LAB — TSH: TSH: 2.16 u[IU]/mL (ref 0.450–4.500)

## 2017-03-29 NOTE — Progress Notes (Signed)
BP (!) 156/82   Pulse 68   Temp 97.5 F (36.4 C) (Oral)   Ht 5' 1"  (1.549 m)   Wt 176 lb (79.8 kg)   BMI 33.25 kg/m    Subjective:    Patient ID: [name], female    DOB: 1945-11-25, 71 y.o.   MRN: [id number]  HPI: [name] is a 71 y.o. female presenting on 03/28/2017 for Establish Care  This is a new patient for [place] today. She is attended by her daughter. She has come to live in this area. Years ago she had lived here. She has been in [place] for many years. The son is still in that area. Medical records will be sent from the providers in that area. There is been a markedly change in one year of her memory, speech, activity level and overall health. All she still appears to look quite healthy there has been a drastic change in her cognition and memory. She is not remembering anything in remote or current times. The daughter has noticed that there has been this dramatic change. She was here approximately one year ago on vacation and when she came this time there was quite a change. She at times is quite irritable in personality. She has had forgetfulness over every aspect of her life. It appears that she has been on Aricept 5 mg for some time. But she is a very poor historian and unable to me why she has been on this. She also has a history of a distant stroke. It was related to a DVT. She has had overall well controlled diabetes without complications. She has had hyperlipidemia that has been well controlled. Her blood pressures been well controlled. Another new market change has been urinary frequency and incontinence. She voids throughout the night. She is not drinking very much but is still voiding a lot. I am very concerned about a normal pressure hydrocephalus condition that could be adding to her being excessively voiding. We will try to get a neurology appointment as soon as possible.  Relevant past medical, surgical, family and social history reviewed and updated  as indicated. Allergies and medications reviewed and updated.  Past Medical History:  Diagnosis Date  . Anxiety   . Bipolar affective disorder ([place])   . CAD (coronary artery disease)   . Deep vein thrombosis (DVT) ([place])   . Diabetes mellitus without complication ([place])   . Hypertension   . Sleep apnea   . Stroke ([place])   . Vascular dementia     Past Surgical History:  Procedure Laterality Date  . CARDIAC CATHETERIZATION  2003  . cataract proceure Left 10/17/2001    Review of Systems  Constitutional: Positive for fatigue. Negative for activity change and fever.  HENT: Negative.   Eyes: Negative.   Respiratory: Negative.  Negative for cough.   Cardiovascular: Negative.  Negative for chest pain.  Gastrointestinal: Negative.  Negative for abdominal pain.  Endocrine: Negative.   Genitourinary: Positive for frequency and urgency. Negative for decreased urine volume, difficulty urinating, dyspareunia and dysuria.  Musculoskeletal: Negative.   Skin: Negative.   Neurological: Positive for dizziness and speech difficulty. Negative for tremors, seizures, syncope, weakness, light-headedness, numbness and headaches.    Allergies as of 03/28/2017      Reactions   Codeine       Medication List       Accurate as of 03/28/17 11:59 PM. Always use your most recent med list.  aspirin EC 81 MG tablet Take 81 mg by mouth daily.   atorvastatin 40 MG tablet Commonly known as:  LIPITOR Take 40 mg by mouth daily.   blood glucose meter kit and supplies Kit Use 4x day. Dx Code E11.9   donepezil 10 MG tablet Commonly known as:  ARICEPT Take 1 tablet (10 mg total) by mouth at bedtime.   losartan-hydrochlorothiazide 50-12.5 MG tablet Commonly known as:  HYZAAR Take 1 tablet by mouth daily.   MELATONIN PO Take by mouth.   metFORMIN 500 MG tablet Commonly known as:  GLUCOPHAGE Take 1 tablet (500 mg total) by mouth 2 (two) times daily with a meal.   multivitamin  tablet Take 1 tablet by mouth daily.   PROBIOTIC DAILY PO Take by mouth.   sitaGLIPtin 100 MG tablet Commonly known as:  JANUVIA Take 100 mg by mouth daily.   topiramate 100 MG tablet Commonly known as:  TOPAMAX Take 100 mg by mouth 2 (two) times daily.   venlafaxine 75 MG tablet Commonly known as:  EFFEXOR Take 75 mg by mouth 2 (two) times daily.   Vitamin D3 1000 units Caps Take by mouth.          Objective:    BP (!) 156/82   Pulse 68   Temp 97.5 F (36.4 C) (Oral)   Ht 5' 1"  (1.549 m)   Wt 176 lb (79.8 kg)   BMI 33.25 kg/m   Allergies  Allergen Reactions  . Codeine     Physical Exam  Constitutional: She appears well-developed and well-nourished. No distress.  HENT:  Head: Normocephalic and atraumatic.  Right Ear: Tympanic membrane, external ear and ear canal normal.  Left Ear: Tympanic membrane, external ear and ear canal normal.  Nose: Nose normal. No rhinorrhea.  Mouth/Throat: Oropharynx is clear and moist and mucous membranes are normal. No oropharyngeal exudate or posterior oropharyngeal erythema.  Eyes: Conjunctivae and EOM are normal. Pupils are equal, round, and reactive to light.  Neck: Normal range of motion. Neck supple.  Cardiovascular: Normal rate, regular rhythm, normal heart sounds and intact distal pulses.   Pulmonary/Chest: Effort normal and breath sounds normal.  Abdominal: Soft. Bowel sounds are normal.  Neurological: She is alert. She has normal strength and normal reflexes. No cranial nerve deficit or sensory deficit. She displays a negative Romberg sign.  Very low verbal responses. Did not perform a MMSE today. There would've been an extremely low score. Planning neurology evaluation as soon as possible.  Skin: Skin is warm and dry. No rash noted. She is not diaphoretic.  Psychiatric: She has a normal mood and affect. Her behavior is normal. Judgment and thought content normal.    Results for orders placed or performed in visit on  03/28/17  CMP14+EGFR  Result Value Ref Range   Glucose 99 65 - 99 mg/dL   BUN 12 8 - 27 mg/dL   Creatinine, Ser 0.78 0.57 - 1.00 mg/dL   GFR calc non Af Amer 77 >59 mL/min/1.73   GFR calc Af Amer 89 >59 mL/min/1.73   BUN/Creatinine Ratio 15 12 - 28   Sodium 141 134 - 144 mmol/L   Potassium 3.7 3.5 - 5.2 mmol/L   Chloride 101 96 - 106 mmol/L   CO2 25 20 - 29 mmol/L   Calcium 9.2 8.7 - 10.3 mg/dL   Total Protein 6.6 6.0 - 8.5 g/dL   Albumin 3.9 3.5 - 4.8 g/dL   Globulin, Total 2.7 1.5 - 4.5 g/dL  Albumin/Globulin Ratio 1.4 1.2 - 2.2   Bilirubin Total 0.3 0.0 - 1.2 mg/dL   Alkaline Phosphatase 91 39 - 117 IU/L   AST 22 0 - 40 IU/L   ALT 20 0 - 32 IU/L  Lipid panel  Result Value Ref Range   Cholesterol, Total 171 100 - 199 mg/dL   Triglycerides 40 0 - 149 mg/dL   HDL 74 >39 mg/dL   VLDL Cholesterol Cal 8 5 - 40 mg/dL   LDL Calculated 89 0 - 99 mg/dL   Chol/HDL Ratio 2.3 0.0 - 4.4 ratio  TSH  Result Value Ref Range   TSH 2.160 0.450 - 4.500 uIU/mL  Bayer DCA Hb A1c Waived  Result Value Ref Range   Bayer DCA Hb A1c Waived 6.2 <7.0 %  CBC with Differential/Platelet  Result Value Ref Range   WBC 7.6 3.4 - 10.8 x10E3/uL   RBC 4.63 3.77 - 5.28 x10E6/uL   Hemoglobin 13.6 11.1 - 15.9 g/dL   Hematocrit 40.9 34.0 - 46.6 %   MCV 88 79 - 97 fL   MCH 29.4 26.6 - 33.0 pg   MCHC 33.3 31.5 - 35.7 g/dL   RDW 13.6 12.3 - 15.4 %   Platelets 228 150 - 379 x10E3/uL   Neutrophils 65 Not Estab. %   Lymphs 28 Not Estab. %   Monocytes 5 Not Estab. %   Eos 2 Not Estab. %   Basos 0 Not Estab. %   Neutrophils Absolute 4.9 1.4 - 7.0 x10E3/uL   Lymphocytes Absolute 2.1 0.7 - 3.1 x10E3/uL   Monocytes Absolute 0.4 0.1 - 0.9 x10E3/uL   EOS (ABSOLUTE) 0.1 0.0 - 0.4 x10E3/uL   Basophils Absolute 0.0 0.0 - 0.2 x10E3/uL   Immature Granulocytes 0 Not Estab. %   Immature Grans (Abs) 0.0 0.0 - 0.1 x10E3/uL      Assessment & Plan:   1. Memory loss - donepezil (ARICEPT) 10 MG tablet; Take 1 tablet  (10 mg total) by mouth at bedtime.  Dispense: 30 tablet; Refill: 1 - Ambulatory referral to Neurology - CMP14+EGFR - Lipid panel - TSH - Bayer DCA Hb A1c Waived - CBC with Differential/Platelet  2. History of stroke - Ambulatory referral to Neurology  3. Diabetes mellitus without complication ([place]) - JKD32+IZTI - Lipid panel - Bayer DCA Hb A1c Waived - blood glucose meter kit and supplies KIT; Use 4x day. Dx Code E11.9  Dispense: 1 each; Refill: 0  4. DVT, HX OF  5. Hyperlipidemia associated with type 2 diabetes mellitus (HCC) - atorvastatin (LIPITOR) 40 MG tablet; Take 40 mg by mouth daily. - Lipid panel  6. Essential hypertension - losartan-hydrochlorothiazide (HYZAAR) 50-12.5 MG tablet; Take 1 tablet by mouth daily. - CMP14+EGFR - Lipid panel - TSH - Bayer DCA Hb A1c Waived - CBC with Differential/Platelet  7. Urine frequency Concern for normal pressure hydrocephalus in light of her memory loss and personality change. Neurology referral  8. Urinary incontinence, unspecified type   Current Outpatient Prescriptions:  .  aspirin EC 81 MG tablet, Take 81 mg by mouth daily., Disp: , Rfl:  .  atorvastatin (LIPITOR) 40 MG tablet, Take 40 mg by mouth daily., Disp: , Rfl:  .  Cholecalciferol (VITAMIN D3) 1000 units CAPS, Take by mouth., Disp: , Rfl:  .  donepezil (ARICEPT) 10 MG tablet, Take 1 tablet (10 mg total) by mouth at bedtime., Disp: 30 tablet, Rfl: 1 .  losartan-hydrochlorothiazide (HYZAAR) 50-12.5 MG tablet, Take 1 tablet by mouth daily., Disp: ,  Rfl:  .  MELATONIN PO, Take by mouth., Disp: , Rfl:  .  metFORMIN (GLUCOPHAGE) 500 MG tablet, Take 1 tablet (500 mg total) by mouth 2 (two) times daily with a meal., Disp: 60 tablet, Rfl: 1 .  Multiple Vitamin (MULTIVITAMIN) tablet, Take 1 tablet by mouth daily., Disp: , Rfl:  .  Probiotic Product (PROBIOTIC DAILY PO), Take by mouth., Disp: , Rfl:  .  sitaGLIPtin (JANUVIA) 100 MG tablet, Take 100 mg by mouth daily., Disp:  , Rfl:  .  topiramate (TOPAMAX) 100 MG tablet, Take 100 mg by mouth 2 (two) times daily., Disp: , Rfl:  .  venlafaxine (EFFEXOR) 75 MG tablet, Take 75 mg by mouth 2 (two) times daily., Disp: , Rfl:  .  blood glucose meter kit and supplies KIT, Use 4x day. Dx Code E11.9, Disp: 1 each, Rfl: 0  Continue all other maintenance medications as listed above.  Follow up plan: Return in about 4 weeks (around 04/25/2017) for recheck.  Educational handout given for normal pressure hydrocephalus  Terald Sleeper PA-C [facility] [address]  [place], [place] [address] [phone]   03/29/2017, 10:37 AM

## 2017-04-05 ENCOUNTER — Encounter: Payer: Self-pay | Admitting: Neurology

## 2017-04-05 ENCOUNTER — Ambulatory Visit (INDEPENDENT_AMBULATORY_CARE_PROVIDER_SITE_OTHER): Payer: Medicare Other | Admitting: Neurology

## 2017-04-05 VITALS — BP 151/76 | HR 64 | Ht 61.0 in | Wt 177.0 lb

## 2017-04-05 DIAGNOSIS — E538 Deficiency of other specified B group vitamins: Secondary | ICD-10-CM

## 2017-04-05 DIAGNOSIS — G309 Alzheimer's disease, unspecified: Secondary | ICD-10-CM

## 2017-04-05 DIAGNOSIS — R413 Other amnesia: Secondary | ICD-10-CM

## 2017-04-05 DIAGNOSIS — G301 Alzheimer's disease with late onset: Secondary | ICD-10-CM

## 2017-04-05 DIAGNOSIS — F0280 Dementia in other diseases classified elsewhere without behavioral disturbance: Secondary | ICD-10-CM | POA: Diagnosis not present

## 2017-04-05 HISTORY — DX: Dementia in other diseases classified elsewhere, unspecified severity, without behavioral disturbance, psychotic disturbance, mood disturbance, and anxiety: F02.80

## 2017-04-05 MED ORDER — MIRTAZAPINE 15 MG PO TABS: 15.0000 mg | ORAL_TABLET | Freq: Every day | ORAL | 2 refills | Status: DC

## 2017-04-05 NOTE — Progress Notes (Signed)
Reason for visit: Memory disturbance  Referring physician: Dr. Corey Skains is a 71 y.o. female  History of present illness:  [name] is a 71 year old right-handed Hispanic female with a progressive history of memory disturbance. The patient was seen through this office in 2008 with a history of cerebrovascular disease, the patient was reporting some memory issues at that time. The patient has a very prominent family history of progressive dementia with 2 siblings who have passed away with Alzheimer's disease and 2 other living siblings who have Alzheimer's disease. The patient has 12 siblings total. The patient currently is living with her daughter, she had been living in [place] for the last years. She seems to have had a significant worsening of her cognitive status particularly over the last 6 months. She does not operate a motor vehicle. The patient is having increasing problems with word finding, she has difficulty remembering that her parents passed away, she is having problems with hallucinations. She is not sleeping well at night because she is up and down and trying to wander to the house. She cannot remember where the bathroom is and she cannot recognize the house as her own at times. The patient has no balance troubles or difficulty with weakness. She does have diabetes, there is no recent history of hypoglycemia. The patient does have some problems with urinary incontinence at times, she is wearing adult diapers. She is on Aricept taking 10 mg at night. She is sent to this office for an evaluation.   Past Medical History:  Diagnosis Date  . Anxiety   . Bipolar affective disorder ([place])   . CAD (coronary artery disease)   . Deep vein thrombosis (DVT) ([place])   . Diabetes mellitus without complication ([place])   . Hypertension   . Sleep apnea   . Stroke ([place])   . Vascular dementia     Past Surgical History:  Procedure Laterality Date  . CARDIAC  CATHETERIZATION  2003  . cataract proceure Left 10/17/2001    Family History  Problem Relation Age of Onset  . Diabetes Mother   . Hypertension Mother   . Heart disease Mother   . Diabetes Father   . Hypertension Father   . Heart disease Father   . Stroke Sister   . Stroke Brother   . Alzheimer's disease Sister   . Diabetes Sister   . Clotting disorder Sister     Social history:  reports that she has never smoked. She has never used smokeless tobacco. She reports that she does not drink alcohol or use drugs.  Medications:  Prior to Admission medications   Medication Sig Start Date End Date Taking? Authorizing Provider  aspirin EC 81 MG tablet Take 81 mg by mouth daily.   Yes [provider]  atorvastatin (LIPITOR) 40 MG tablet Take 40 mg by mouth daily.   Yes [provider]  blood glucose meter kit and supplies KIT Use 4x day. Dx Code E11.9 03/28/17  Yes Terald Sleeper, PA-C  Cholecalciferol (VITAMIN D3) 1000 units CAPS Take by mouth.   Yes [provider]  donepezil (ARICEPT) 10 MG tablet Take 1 tablet (10 mg total) by mouth at bedtime. 03/28/17  Yes Terald Sleeper, PA-C  losartan-hydrochlorothiazide (HYZAAR) 50-12.5 MG tablet Take 1 tablet by mouth daily.   Yes [provider]  MELATONIN PO Take by mouth.   Yes [provider]  metFORMIN (GLUCOPHAGE) 500 MG tablet Take 1 tablet (500 mg  total) by mouth 2 (two) times daily with a meal. 07/08/13  Yes Kindl, Nelda Severe, MD  Multiple Vitamin (MULTIVITAMIN) tablet Take 1 tablet by mouth daily.   Yes [provider]  Probiotic Product (PROBIOTIC DAILY PO) Take by mouth.   Yes [provider]  sitaGLIPtin (JANUVIA) 100 MG tablet Take 100 mg by mouth daily.   Yes [provider]  topiramate (TOPAMAX) 100 MG tablet Take 100 mg by mouth 2 (two) times daily.   Yes [provider]  venlafaxine (EFFEXOR) 75 MG tablet Take 75 mg by mouth 2 (two) times daily.   Yes  [provider]      Allergies  Allergen Reactions  . Codeine     ROS:  Out of a complete 14 system review of symptoms, the patient complains only of the following symptoms, and all other reviewed systems are negative.  Incontinence of the bladder Allergies Memory loss, confusion Insomnia  Blood pressure (!) 151/76, pulse 64, height _0  (1.549 m), weight 177 lb (80.3 kg).  Physical Exam  General: The patient is alert and cooperative at the time of the examination.  Eyes: Pupils are equal, round, and reactive to light. Discs are flat bilaterally.  Neck: The neck is supple, no carotid bruits are noted.  Respiratory: The respiratory examination is clear.  Cardiovascular: The cardiovascular examination reveals a regular rate and rhythm, no obvious murmurs or rubs are noted.  Skin: Extremities are without significant edema.  Neurologic Exam  Mental status: The patient is alert and oriented x 1 at the time of the examination (not oriented to place or date). The Mini-Mental Status Examination done today shows a total score of 15/30.  Cranial nerves: Facial symmetry is present. There is good sensation of the face to pinprick and soft touch bilaterally. The strength of the facial muscles and the muscles to head turning and shoulder shrug are normal bilaterally. Speech is well enunciated, no aphasia or dysarthria is noted. Extraocular movements are full. Visual fields are full. The tongue is midline, and the patient has symmetric elevation of the soft palate. No obvious hearing deficits are noted.  Motor: The motor testing reveals 5 over 5 strength of all 4 extremities. Good symmetric motor tone is noted throughout.  Sensory: Sensory testing is intact to pinprick, soft touch, vibration sensation, and position sense on all 4 extremities. No evidence of extinction is noted.  Coordination: Cerebellar testing reveals good finger-nose-finger and heel-to-shin  bilaterally.  Gait and station: Gait is normal. Tandem gait is normal. Romberg is negative. No drift is seen.  Reflexes: Deep tendon reflexes are symmetric and normal bilaterally. Toes are downgoing bilaterally.   Assessment/Plan:  1. Progressive memory disturbance, Alzheimer's disease  The patient has a very prominent family history of Alzheimer's disease, she likely is getting accelerated memory problems from this disease process. The neurologic examination is surprisingly normal, minimal apraxia is seen. The patient will be sent for CT of the brain, further blood work will be done. She will remain on Aricept 10 mg at night. The patient will be given mirtazapine 15 mg take at night for sleep and help anxiety and agitation issues. The family will call for dose adjustments. She will drop the Effexor dose taking 75 mg once a day rather than twice a day while on the mirtazapine. She will follow-up in 5 months.  Jill Alexanders MD 04/05/2017 1:36 PM  [facility] [address] [place], [place] [address]  Phone [phone]  Fax [phone]

## 2017-04-05 NOTE — Patient Instructions (Signed)
   We will get a CT of the head.  We will start mirtazepine 15 mg at night for sleep and agitation and anxiety.  Call for dose adjustments.  Reduce the Effexor dosing to 75 mg once a day while on the mirtazepine.

## 2017-04-06 ENCOUNTER — Telehealth: Payer: Self-pay | Admitting: *Deleted

## 2017-04-06 LAB — SEDIMENTATION RATE: Sed Rate: 3 mm/hr (ref 0–40)

## 2017-04-06 LAB — VITAMIN B12: Vitamin B-12: 1409 pg/mL — ABNORMAL HIGH (ref 232–1245)

## 2017-04-06 LAB — RPR: RPR Ser Ql: NONREACTIVE

## 2017-04-06 NOTE — Telephone Encounter (Signed)
Called and spoke with Kennon RoundsSally (daughter) (on [place]) about unremarkable labs per CW,MD note. Patient verbalized understanding. Has no further questions at this time.

## 2017-04-06 NOTE — Telephone Encounter (Signed)
-----   Message from Charles K Willis, MD sent at 04/06/2017  7:13 AM EDT -----   The blood work results are unremarkable. Please call the patient.  ----- Message ----- From: Interface, [facility] Lab Results In Sent: 04/06/2017   5:40 AM To: Charles K Willis, MD    

## 2017-04-07 ENCOUNTER — Ambulatory Visit
Admission: RE | Admit: 2017-04-07 | Discharge: 2017-04-07 | Disposition: A | Discharge status: Home / Self Care | Payer: Medicare Other | Source: Ambulatory Visit | Attending: Neurology | Admitting: Neurology

## 2017-04-07 DIAGNOSIS — R413 Other amnesia: Secondary | ICD-10-CM

## 2017-04-09 ENCOUNTER — Telehealth: Payer: Self-pay | Admitting: Neurology

## 2017-04-09 NOTE — Telephone Encounter (Signed)
I called and I talked with the family. The CT of the head shows an old Left parietal CVA, no acute changes. Some atrophy is seen.   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 No acute abnormality

## 2017-04-28 ENCOUNTER — Ambulatory Visit: Payer: Medicare Other | Admitting: Physician Assistant

## 2017-05-01 ENCOUNTER — Encounter: Payer: Self-pay | Admitting: Physician Assistant

## 2017-05-12 ENCOUNTER — Ambulatory Visit (INDEPENDENT_AMBULATORY_CARE_PROVIDER_SITE_OTHER): Payer: Medicare Other | Admitting: Physician Assistant

## 2017-05-12 ENCOUNTER — Encounter: Payer: Self-pay | Admitting: Physician Assistant

## 2017-05-12 VITALS — BP 130/70 | HR 83 | Temp 97.9°F | Ht 61.0 in | Wt 183.6 lb

## 2017-05-12 DIAGNOSIS — R413 Other amnesia: Secondary | ICD-10-CM | POA: Diagnosis not present

## 2017-05-12 DIAGNOSIS — F0280 Dementia in other diseases classified elsewhere without behavioral disturbance: Secondary | ICD-10-CM

## 2017-05-12 DIAGNOSIS — I10 Essential (primary) hypertension: Secondary | ICD-10-CM

## 2017-05-12 DIAGNOSIS — G301 Alzheimer's disease with late onset: Secondary | ICD-10-CM

## 2017-05-12 MED ORDER — MEMANTINE HCL 28 X 5 MG & 21 X 10 MG PO TABS: ORAL_TABLET | ORAL | 0 refills | Status: AC

## 2017-05-12 MED ORDER — LOSARTAN POTASSIUM-HCTZ 50-12.5 MG PO TABS: 1.0000 | ORAL_TABLET | Freq: Every day | ORAL | 3 refills | Status: AC

## 2017-05-12 MED ORDER — TOPIRAMATE 100 MG PO TABS: 100.0000 mg | ORAL_TABLET | Freq: Two times a day (BID) | ORAL | 3 refills | Status: AC

## 2017-05-12 MED ORDER — SITAGLIPTIN PHOSPHATE 100 MG PO TABS: 100.0000 mg | ORAL_TABLET | Freq: Every day | ORAL | 1 refills | Status: DC

## 2017-05-12 MED ORDER — DONEPEZIL HCL 10 MG PO TABS: 10.0000 mg | ORAL_TABLET | Freq: Every day | ORAL | 5 refills | Status: DC

## 2017-05-12 MED ORDER — METFORMIN HCL 500 MG PO TABS: 500.0000 mg | ORAL_TABLET | Freq: Two times a day (BID) | ORAL | 3 refills | Status: AC

## 2017-05-12 NOTE — Patient Instructions (Signed)
In a few days you may receive a survey in the mail or online from [employer] regarding your visit with us today. Please take a moment to fill this out. Your feedback is very important to our whole office. It can help us better understand your needs as well as improve your experience and satisfaction. Thank you for taking your time to complete it. We care about you.  Abdel Effinger, PA-C  

## 2017-05-14 NOTE — Progress Notes (Signed)
BP 130/70   Pulse 83   Temp 97.9 F (36.6 C) (Oral)   Ht 5' 1"  (1.549 m)   Wt 183 lb 9.6 oz (83.3 kg)   BMI 34.69 kg/m    Subjective:    Patient ID: [name], female    DOB: 03/09/1946, 71 y.o.   MRN: [id number]  HPI: [name] is a 71 y.o. female presenting on 05/12/2017 for Follow-up (on memory loss )  This patient comes in for periodic recheck on medications and conditions including alzheimers, hypertension, diabetes. Her dementia is quite advanced. She is being followed by neurology, goes for recheck next month. Her daughter is here to report how she is doing. They are on waitlists for nursing home placement on memory wing.   All medications are reviewed today. There are no reports of any problems with the medications. All of the medical conditions are reviewed and updated.  Lab work is reviewed and will be ordered as medically necessary. There are no new problems reported with today's visit.   Relevant past medical, surgical, family and social history reviewed and updated as indicated. Allergies and medications reviewed and updated.  Past Medical History:  Diagnosis Date  . Alzheimer disease 04/05/2017  . Anxiety   . Bipolar affective disorder ([place])   . CAD (coronary artery disease)   . Deep vein thrombosis (DVT) ([place])   . Diabetes mellitus without complication ([place])   . Hypertension   . Sleep apnea   . Stroke ([place])   . Vascular dementia     Past Surgical History:  Procedure Laterality Date  . CARDIAC CATHETERIZATION  2003  . cataract proceure Left 10/17/2001    Review of Systems  Constitutional: Negative.   HENT: Negative.   Eyes: Negative.   Respiratory: Negative.   Gastrointestinal: Negative.   Genitourinary: Negative.     Allergies as of 05/12/2017      Reactions   Codeine       Medication List       Accurate as of 05/12/17 11:59 PM. Always use your most recent med list.          aspirin EC 81 MG tablet Take 81 mg by mouth  daily.   atorvastatin 40 MG tablet Commonly known as:  LIPITOR Take 40 mg by mouth daily.   blood glucose meter kit and supplies Kit Use 4x day. Dx Code E11.9   donepezil 10 MG tablet Commonly known as:  ARICEPT Take 1 tablet (10 mg total) by mouth at bedtime.   losartan-hydrochlorothiazide 50-12.5 MG tablet Commonly known as:  HYZAAR Take 1 tablet by mouth daily.   MELATONIN PO Take by mouth.   memantine tablet pack Commonly known as:  NAMENDA TITRATION PAK 5 mg/day for =1 week; 5 mg twice daily for =1 week; 15 mg/day given in 5 mg and 10 mg separated doses for =1 week; then 10 mg twice daily   metFORMIN 500 MG tablet Commonly known as:  GLUCOPHAGE Take 1 tablet (500 mg total) by mouth 2 (two) times daily with a meal.   mirtazapine 15 MG tablet Commonly known as:  REMERON Take 1 tablet (15 mg total) by mouth at bedtime.   multivitamin tablet Take 1 tablet by mouth daily.   PROBIOTIC DAILY PO Take by mouth.   sitaGLIPtin 100 MG tablet Commonly known as:  JANUVIA Take 1 tablet (100 mg total) by mouth daily.   topiramate 100 MG tablet Commonly known as:  TOPAMAX Take 1 tablet (100 mg  total) by mouth 2 (two) times daily.   venlafaxine 75 MG tablet Commonly known as:  EFFEXOR Take 75 mg by mouth 2 (two) times daily.   Vitamin D3 1000 units Caps Take by mouth.          Objective:    BP 130/70   Pulse 83   Temp 97.9 F (36.6 C) (Oral)   Ht 5' 1"  (1.549 m)   Wt 183 lb 9.6 oz (83.3 kg)   BMI 34.69 kg/m   Allergies  Allergen Reactions  . Codeine     Physical Exam  Constitutional: She is oriented to person, place, and time. She appears well-developed and well-nourished.  HENT:  Head: Normocephalic and atraumatic.  Eyes: Pupils are equal, round, and reactive to light. Conjunctivae and EOM are normal.  Cardiovascular: Normal rate, regular rhythm, normal heart sounds and intact distal pulses.   Pulmonary/Chest: Effort normal and breath sounds normal.    Abdominal: Soft. Bowel sounds are normal.  Neurological: She is alert and oriented to person, place, and time. She has normal reflexes.  Skin: Skin is warm and dry. No rash noted.  Psychiatric: She has a normal mood and affect. Her behavior is normal. Judgment and thought content normal.  Nursing note and vitals reviewed.   Results for orders placed or performed in visit on 04/05/17  RPR  Result Value Ref Range   RPR Ser Ql Non Reactive Non Reactive  Vitamin B12  Result Value Ref Range   Vitamin B-12 1,409 (H) 232 - 1,245 pg/mL  Sedimentation rate  Result Value Ref Range   Sed Rate 3 0 - 40 mm/hr      Assessment & Plan:   1. Late onset Alzheimer's disease without behavioral disturbance - donepezil (ARICEPT) 10 MG tablet; Take 1 tablet (10 mg total) by mouth at bedtime.  Dispense: 30 tablet; Refill: 5 - memantine (NAMENDA TITRATION PAK) tablet pack; 5 mg/day for =1 week; 5 mg twice daily for =1 week; 15 mg/day given in 5 mg and 10 mg separated doses for =1 week; then 10 mg twice daily  Dispense: 49 tablet; Refill: 0  2. Memory loss - donepezil (ARICEPT) 10 MG tablet; Take 1 tablet (10 mg total) by mouth at bedtime.  Dispense: 30 tablet; Refill: 5 - memantine (NAMENDA TITRATION PAK) tablet pack; 5 mg/day for =1 week; 5 mg twice daily for =1 week; 15 mg/day given in 5 mg and 10 mg separated doses for =1 week; then 10 mg twice daily  Dispense: 49 tablet; Refill: 0  3. Essential hypertension - losartan-hydrochlorothiazide (HYZAAR) 50-12.5 MG tablet; Take 1 tablet by mouth daily.  Dispense: 90 tablet; Refill: 3   Continue all other maintenance medications as listed above.  Follow up plan: Return in about 3 months (around 08/12/2017) for recheck.  Educational handout given for Stanton PA-C [facility] [address]  [place], [place] [address] [phone]   05/14/2017, 10:03 PM

## 2017-05-23 ENCOUNTER — Other Ambulatory Visit: Payer: Self-pay | Admitting: Family Medicine

## 2017-06-01 ENCOUNTER — Other Ambulatory Visit: Payer: Self-pay | Admitting: Family Medicine

## 2017-06-13 ENCOUNTER — Other Ambulatory Visit: Payer: Self-pay | Admitting: Family Medicine

## 2017-06-26 ENCOUNTER — Other Ambulatory Visit: Payer: Self-pay | Admitting: *Deleted

## 2017-06-26 MED ORDER — MIRTAZAPINE 15 MG PO TABS: 15.0000 mg | ORAL_TABLET | Freq: Every day | ORAL | 2 refills | Status: DC

## 2017-06-27 ENCOUNTER — Other Ambulatory Visit: Payer: Self-pay | Admitting: *Deleted

## 2017-06-27 MED ORDER — MIRTAZAPINE 15 MG PO TABS: 15.0000 mg | ORAL_TABLET | Freq: Every day | ORAL | 0 refills | Status: DC

## 2017-08-11 ENCOUNTER — Other Ambulatory Visit: Payer: Self-pay | Admitting: *Deleted

## 2017-08-11 MED ORDER — SITAGLIPTIN PHOSPHATE 100 MG PO TABS: 100.0000 mg | ORAL_TABLET | Freq: Every day | ORAL | 0 refills | Status: AC

## 2017-09-05 ENCOUNTER — Ambulatory Visit: Payer: Medicare Other | Admitting: Adult Health

## 2017-09-05 ENCOUNTER — Telehealth: Payer: Self-pay | Admitting: *Deleted

## 2017-09-05 NOTE — Telephone Encounter (Signed)
Patient was no show for follow up with NP today.  

## 2017-09-06 ENCOUNTER — Encounter: Payer: Self-pay | Admitting: Adult Health

## 2017-09-22 ENCOUNTER — Other Ambulatory Visit: Payer: Self-pay | Admitting: Family Medicine

## 2017-09-22 DIAGNOSIS — E785 Hyperlipidemia, unspecified: Principal | ICD-10-CM

## 2017-09-22 DIAGNOSIS — E1169 Type 2 diabetes mellitus with other specified complication: Secondary | ICD-10-CM

## 2017-09-27 ENCOUNTER — Other Ambulatory Visit: Payer: Self-pay

## 2017-09-27 DIAGNOSIS — F0280 Dementia in other diseases classified elsewhere without behavioral disturbance: Secondary | ICD-10-CM

## 2017-09-27 DIAGNOSIS — R413 Other amnesia: Secondary | ICD-10-CM

## 2017-09-27 DIAGNOSIS — G301 Alzheimer's disease with late onset: Secondary | ICD-10-CM

## 2017-09-27 MED ORDER — DONEPEZIL HCL 10 MG PO TABS
10.0000 mg | ORAL_TABLET | Freq: Every day | ORAL | 0 refills | Status: AC
Start: 2017-09-27 — End: ?

## 2017-10-24 ENCOUNTER — Other Ambulatory Visit: Payer: Self-pay | Admitting: *Deleted

## 2017-10-24 MED ORDER — MIRTAZAPINE 15 MG PO TABS: 15.0000 mg | ORAL_TABLET | Freq: Every day | ORAL | 1 refills | Status: AC
